# Patient Record
Sex: Female | Born: 1990
Health system: Southern US, Community
[De-identification: ages and names within clinical notes are randomized; demographics above are authoritative.]

## PROBLEM LIST (undated history)

## (undated) DIAGNOSIS — T7840XA Allergy, unspecified, initial encounter: Secondary | ICD-10-CM

## (undated) DIAGNOSIS — G43909 Migraine, unspecified, not intractable, without status migrainosus: Secondary | ICD-10-CM

## (undated) HISTORY — DX: Allergy, unspecified, initial encounter: T78.40XA

## (undated) HISTORY — DX: Migraine, unspecified, not intractable, without status migrainosus: G43.909

---

## 2017-09-26 ENCOUNTER — Ambulatory Visit: Payer: Self-pay | Admitting: Family Medicine

## 2017-09-26 DIAGNOSIS — Z0289 Encounter for other administrative examinations: Secondary | ICD-10-CM

## 2017-09-27 ENCOUNTER — Encounter: Payer: Self-pay | Admitting: Medical

## 2017-09-27 ENCOUNTER — Ambulatory Visit (INDEPENDENT_AMBULATORY_CARE_PROVIDER_SITE_OTHER): Payer: No Typology Code available for payment source | Admitting: Medical

## 2017-09-27 VITALS — BP 110/74 | HR 92 | Temp 98.2°F | Resp 16 | Ht 62.0 in | Wt 145.6 lb

## 2017-09-27 DIAGNOSIS — Z3A01 Less than 8 weeks gestation of pregnancy: Secondary | ICD-10-CM | POA: Diagnosis not present

## 2017-09-27 LAB — HCG, QUANTITATIVE, PREGNANCY: Quantitative HCG: 146.55 m[IU]/mL

## 2017-09-27 NOTE — Progress Notes (Signed)
Subjective:    Patient ID: Toni Ball, female    DOB: 1991/01/06, 27 y.o.   MRN: [id number]  HPI  Pt in for first time.  Pt has no chronic medical problems except seasonal allergies. They are controlled presently.  LMP- 08-25-2017. She was late on 09-24-2017. Pt checked yesterday and had + pregnancy. Today confirmed. Pt has no hx of prior pregnancy.  Pt just graduated from nursing recently. Pt working at [employer]. Pt has been together with father for 9 years.  Pt was trying in past to get pregnant but could not get pregnant.   Pt was off birth control for about a month before getting pregnant.    Review of Systems  Constitutional: Negative for chills, fatigue and fever.  Respiratory: Negative for cough, chest tightness, shortness of breath and wheezing.   Cardiovascular: Negative for chest pain and palpitations.  Gastrointestinal: Negative for abdominal distention, abdominal pain, constipation, diarrhea, nausea and vomiting.  Genitourinary: Negative for difficulty urinating, dysuria, flank pain, frequency and urgency.  Musculoskeletal: Negative for back pain and neck pain.  Neurological: Negative for syncope, facial asymmetry, weakness, light-headedness and numbness.  Hematological: Negative for adenopathy. Does not bruise/bleed easily.  Psychiatric/Behavioral: Negative for behavioral problems, decreased concentration and dysphoric mood. The patient is not nervous/anxious.      Past Medical History:  Diagnosis Date  . Allergy   . Migraine      Social History   Socioeconomic History  . Marital status: Single    Spouse name: Not on file  . Number of children: Not on file  . Years of education: Not on file  . Highest education level: Not on file  Occupational History  . Not on file  Social Needs  . Financial resource strain: Not on file  . Food insecurity:    Worry: Not on file    Inability: Not on file  . Transportation needs:    Medical: Not on file   Non-medical: Not on file  Tobacco Use  . Smoking status: Never Smoker  . Smokeless tobacco: Never Used  Substance and Sexual Activity  . Alcohol use: Never    Frequency: Never  . Drug use: Never  . Sexual activity: Yes  Lifestyle  . Physical activity:    Days per week: Not on file    Minutes per session: Not on file  . Stress: Not on file  Relationships  . Social connections:    Talks on phone: Not on file    Gets together: Not on file    Attends religious service: Not on file    Active member of club or organization: Not on file    Attends meetings of clubs or organizations: Not on file    Relationship status: Not on file  . Intimate partner violence:    Fear of current or ex partner: Not on file    Emotionally abused: Not on file    Physically abused: Not on file    Forced sexual activity: Not on file  Other Topics Concern  . Not on file  Social History Narrative  . Not on file    History reviewed. No pertinent surgical history.  Family History  Problem Relation Age of Onset  . Hyperlipidemia Father     Allergies  Allergen Reactions  . Peanut-Containing Drug Products Anaphylaxis  . Eggs Or Egg-Derived Products   . Milk-Related Compounds     Current Outpatient Medications on File Prior to Visit  Medication Sig Dispense Refill  .  Loratadine 10 MG CAPS Take by mouth.     No current facility-administered medications on file prior to visit.     BP 110/74   Pulse 92   Temp 98.2 F (36.8 C) (Oral)   Resp 16   Ht  (1.575 m)   Wt 145 lb 9.6 oz (66 kg)   SpO2 100%   BMI 26.63 kg/m       Objective:   Physical Exam  General- No acute distress. Pleasant patient.        Assessment & Plan:  Your pregnancy test came back positive.  After discussion, we decided to get a quantitative hCG test.  Please go ahead and start prenatal vitamin.  You found gummy form prenatal vitamin  that you prefer.  So please start that daily.  Referral to OB placed.  I  am not sure how long that will take as you are young and in good health.  So first appointment might be weeks out.  If you have any problems or questions please let me know.  Follow-up as needed.  Estimated date of delivery approx 06-03-2018.

## 2017-09-27 NOTE — Patient Instructions (Signed)
Your pregnancy test came back positive.  After discussion, we decided to get a quantitative hCG test.  Please go ahead and start prenatal vitamin.  You found gummy form prenatal vitamin  that you prefer.  So please start that daily.  Referral to OB placed.  I am not sure how long that will take as you are young and in good health.  So first appointment might be weeks out.  If you have any problems or questions please let me know.  Follow-up as needed.  Estimated date of delivery approx 06-03-2018.

## 2017-09-30 ENCOUNTER — Telehealth: Payer: Self-pay

## 2017-09-30 NOTE — Telephone Encounter (Signed)
Pt. called [facility], and call transferred to author. Author relayed HcG level of 146 per lab drawn on 5/10, and confirmed that pt. has an OB referral from [facility], [place]. Pt had no other questions or concerns at this time.

## 2017-10-02 ENCOUNTER — Ambulatory Visit: Payer: Self-pay | Admitting: *Deleted

## 2017-10-02 NOTE — Telephone Encounter (Signed)
Patient asked if okay to take Benadryl today after picking strawberries. She takes Claritin daily. Advice given taking benadryl today-drink plenty of water today.  Reason for Disposition . Caller requesting information about medication during pregnancy; adult is not ill and triager answers question  Answer Assessment - Initial Assessment Questions 1. SYMPTOMS: "Do you have any symptoms?"     Runny nose after picking strawberries.  2. SEVERITY: If symptoms are present, ask "Are they mild, moderate or severe?"  Protocols used: MEDICATION QUESTION CALL-A-AH

## 2017-10-04 ENCOUNTER — Encounter: Payer: Self-pay | Admitting: Family Medicine

## 2017-10-04 ENCOUNTER — Ambulatory Visit (INDEPENDENT_AMBULATORY_CARE_PROVIDER_SITE_OTHER): Payer: No Typology Code available for payment source | Admitting: Family Medicine

## 2017-10-04 VITALS — BP 109/64 | HR 60 | Wt 147.0 lb

## 2017-10-04 DIAGNOSIS — O3680X0 Pregnancy with inconclusive fetal viability, not applicable or unspecified: Secondary | ICD-10-CM | POA: Diagnosis not present

## 2017-10-04 NOTE — Progress Notes (Signed)
   Subjective:    Patient ID: Toni Ball, female    DOB: 12-30-1990, 27 y.o.   MRN: [id number]  HPI Patient seen for early pregnancy. US shows questionable viability: GS measuring 5wks, but no fetal pole seen. Mild abdominal cramping, no pain.   Review of Systems     Objective:   Physical Exam  Constitutional: She is oriented to person, place, and time. She appears well-developed and well-nourished.  Cardiovascular: Normal rate.  Pulmonary/Chest: Effort normal.  Abdominal: Soft. She exhibits no distension and no mass. There is no tenderness. There is no rebound and no guarding.  Neurological: She is alert and oriented to person, place, and time.      Assessment & Plan:  1. Pregnancy with inconclusive fetal viability, single or unspecified fetus Discussed follow up in 2 weeks to confirm viability, but pt would like to wait until original scheduled appt in 1 month. Ectopic precautions given. - Urine Culture

## 2017-10-04 NOTE — Progress Notes (Signed)
Patient thought appointment for New OB was today. She was scheduled for new ob on June 17th but wanted to be seen today to establish care. Armandina Stammer RN    DATING AND VIABILITY SONOGRAM   Toni Ball is a 27 y.o. year old G1P0 with LMP Patient's last menstrual period was 08/25/2017 (exact date). which would correlate to  [redacted]w[redacted]d weeks gestation.  She has regular menstrual cycles.   She is here today for a confirmatory initial sonogram.    GESTATION: SINGLETON     FETAL ACTIVITY:          Heart rate       None visulalized            ADNEXA: The ovaries are normal.   GESTATIONAL AGE AND  BIOMETRICS:  Gestational criteria: Estimated Date of Delivery: 06/01/18 by LMP now at [redacted]w[redacted]d  Previous Scans:0  GESTATIONAL SAC           0.529cm         5-2 weeks                                                                                   AVERAGE EGA(BY THIS SCAN):  5-2weeks  WORKING EDD( LMP ):  06-01-17    Armandina Stammer 10/04/2017 10:15 AM

## 2017-10-07 ENCOUNTER — Ambulatory Visit (INDEPENDENT_AMBULATORY_CARE_PROVIDER_SITE_OTHER): Payer: No Typology Code available for payment source | Admitting: Medical

## 2017-10-07 ENCOUNTER — Telehealth: Payer: Self-pay

## 2017-10-07 ENCOUNTER — Encounter: Payer: Self-pay | Admitting: Medical

## 2017-10-07 ENCOUNTER — Other Ambulatory Visit: Payer: Self-pay | Admitting: Family Medicine

## 2017-10-07 VITALS — BP 100/68 | HR 72 | Temp 98.4°F | Ht 62.0 in | Wt 146.0 lb

## 2017-10-07 DIAGNOSIS — J301 Allergic rhinitis due to pollen: Secondary | ICD-10-CM | POA: Diagnosis not present

## 2017-10-07 DIAGNOSIS — N3000 Acute cystitis without hematuria: Secondary | ICD-10-CM

## 2017-10-07 DIAGNOSIS — H1031 Unspecified acute conjunctivitis, right eye: Secondary | ICD-10-CM

## 2017-10-07 LAB — URINE CULTURE

## 2017-10-07 MED ORDER — AMOXICILLIN 500 MG PO CAPS
500.0000 mg | ORAL_CAPSULE | Freq: Three times a day (TID) | ORAL | 0 refills | Status: DC
Start: 2017-10-07 — End: 2017-11-12

## 2017-10-07 MED ORDER — TOBRAMYCIN 0.3 % OP SOLN: 2.0000 [drp] | Freq: Four times a day (QID) | OPHTHALMIC | 0 refills | Status: DC

## 2017-10-07 MED ORDER — NEDOCROMIL SODIUM 2 % OP SOLN: 1.0000 [drp] | Freq: Two times a day (BID) | OPHTHALMIC | 0 refills | Status: DC | PRN

## 2017-10-07 NOTE — Progress Notes (Signed)
Pre visit review using our clinic review tool, if applicable. No additional management support is needed unless otherwise documented below in the visit note. 

## 2017-10-07 NOTE — Telephone Encounter (Signed)
-----   Message from Reva Bores, MD sent at 10/07/2017  2:34 PM EDT ----- Has UTI--rx sent in

## 2017-10-07 NOTE — Patient Instructions (Addendum)
   You do appear to have some right-sided bacterial conjunctivitis.  Highly suspicious based on your dried crusty matting to right eyes this morning.  I am prescribing Tobrex eyedrops.  I think you can return to work on Wednesday provided you do not have any recurrent matting during the day on Tuesday.  Also you describe allergic rhinitis with some intermittent episodes of allergy conjunctivitis type symptoms.  After your right eye clears with the bacterial conjunctivitis can use Alocril eyedrops as an add-on to your antihistamine treatment.    Follow-up as needed for any worsening or persisting signs or symptoms.

## 2017-10-07 NOTE — Progress Notes (Signed)
Subjective:    Patient ID: Toni Ball, female    DOB: 05-20-1991, 27 y.o.   MRN: [id number]  HPI  Pt in for rt eye pinkish appearance with some yellow crusting today. Pt had to moisten her eye before she could open this morning. She works as Engineer, civil (consulting).  Pt is pregnant. I saw her just recently. Pt went with her OB. She states everything is good.  Pt took benadryl and claritin. Her ob told her those were ok.      Review of Systems  Constitutional: Negative for chills and fever.  HENT: Positive for congestion. Negative for ear pain, facial swelling, postnasal drip, sinus pressure and sneezing.   Eyes: Positive for redness and itching. Negative for photophobia and visual disturbance.  Respiratory: Negative for cough, chest tightness, shortness of breath and wheezing.   Cardiovascular: Negative for chest pain and palpitations.  Gastrointestinal: Negative for abdominal pain, constipation, nausea and vomiting.  Musculoskeletal: Negative for back pain, myalgias and neck pain.  Skin: Negative for rash.  Hematological: Negative for adenopathy. Does not bruise/bleed easily.  Psychiatric/Behavioral: Negative for behavioral problems, confusion and dysphoric mood.   Past Medical History:  Diagnosis Date  . Allergy   . Migraine      Social History   Socioeconomic History  . Marital status: Single    Spouse name: Not on file  . Number of children: Not on file  . Years of education: Not on file  . Highest education level: Not on file  Occupational History  . Not on file  Social Needs  . Financial resource strain: Not on file  . Food insecurity:    Worry: Not on file    Inability: Not on file  . Transportation needs:    Medical: Not on file    Non-medical: Not on file  Tobacco Use  . Smoking status: Never Smoker  . Smokeless tobacco: Never Used  Substance and Sexual Activity  . Alcohol use: Never    Frequency: Never  . Drug use: Never  . Sexual activity: Yes  Lifestyle  .  Physical activity:    Days per week: Not on file    Minutes per session: Not on file  . Stress: Not on file  Relationships  . Social connections:    Talks on phone: Not on file    Gets together: Not on file    Attends religious service: Not on file    Active member of club or organization: Not on file    Attends meetings of clubs or organizations: Not on file    Relationship status: Not on file  . Intimate partner violence:    Fear of current or ex partner: Not on file    Emotionally abused: Not on file    Physically abused: Not on file    Forced sexual activity: Not on file  Other Topics Concern  . Not on file  Social History Narrative  . Not on file    No past surgical history on file.  Family History  Problem Relation Age of Onset  . Cancer Mother   . Hypertension Mother   . Hyperlipidemia Father   . Hypertension Father   . Hypertension Maternal Grandmother   . Hypertension Maternal Grandfather   . Diabetes Paternal Grandmother   . Hypertension Paternal Grandmother   . Hypertension Paternal Grandfather   . Stroke Neg Hx     Allergies  Allergen Reactions  . Peanut-Containing Drug Products Anaphylaxis  . Eggs Or Egg-Derived  Products   . Milk-Related Compounds     Current Outpatient Medications on File Prior to Visit  Medication Sig Dispense Refill  . Loratadine 10 MG CAPS Take by mouth.     No current facility-administered medications on file prior to visit.     BP 92/62 (BP Location: Left Arm, Patient Position: Sitting, Cuff Size: Normal)   Pulse 72   Temp 98.4 F (36.9 C) (Oral)   Ht  (1.575 m)   Wt 146 lb (66.2 kg)   LMP 08/25/2017 (Exact Date)   SpO2 97%   BMI 26.70 kg/m        Objective:   Physical Exam  General  Mental Status - Alert. General Appearance - Well groomed. Not in acute distress.  Skin Rashes- No Rashes.  HEENT Head- Normal. Ear Auditory Canal - Left- Normal. Right - Normal.Tympanic Membrane- Left- Normal. Right-  Normal. Eye Sclera/Conjunctiva- Left- Normal. Right- Normal. Nose & Sinuses Nasal Mucosa- Left-  Boggy and Congested. Right-  Boggy and  Congested.Bilateral no maxillary and no  frontal sinus pressure. Rt eye mild injected conjunctiva. No matting presently. Left eye is clear. Mouth & Throat Lips: Upper Lip- Normal: no dryness, cracking, pallor, cyanosis, or vesicular eruption. Lower Lip-Normal: no dryness, cracking, pallor, cyanosis or vesicular eruption. Buccal Mucosa- Bilateral- No Aphthous ulcers. Oropharynx- No Discharge or Erythema. Tonsils: Characteristics- Bilateral- No Erythema or Congestion. Size/Enlargement- Bilateral- No enlargement. Discharge- bilateral-None.  Neck Neck- Supple. No Masses.   Chest and Lung Exam Auscultation: Breath Sounds:-Clear even and unlabored.  Cardiovascular Auscultation:Rythm- Regular, rate and rhythm. Murmurs & Other Heart Sounds:Ausculatation of the heart reveal- No Murmurs.  Lymphatic Head & Neck General Head & Neck Lymphatics: Bilateral: Description- No Localized lymphadenopathy.       Assessment & Plan:  You do appear to have some right-sided bacterial conjunctivitis.  Highly suspicious based on your dried crusty matting to right eyes this morning.  I am prescribing Tobrex eyedrops.  I think you can return to work on Wednesday provided you do not have any recurrent matting during the day on Tuesday.  Also you describe allergic rhinitis with some intermittent episodes of allergy conjunctivitis type symptoms.  After your right eye clears with the bacterial conjunctivitis can use Alocril eyedrops as an add-on to your antihistamine treatment.    Follow-up as needed for any worsening or persisting signs or symptoms.  Confirmed class b with our pharmacist today. Advised use tobrex first and get eye cleared then can start alocril as neeeded.  Esperanza Richters, PA-C

## 2017-10-07 NOTE — Telephone Encounter (Signed)
Patient made aware of UTI and need to pick up antibiotic and take for seven days. Patient states understanding. Armandina Stammer RN

## 2017-10-15 ENCOUNTER — Telehealth: Payer: Self-pay

## 2017-10-15 ENCOUNTER — Encounter: Payer: Self-pay | Admitting: Family Medicine

## 2017-10-15 NOTE — Telephone Encounter (Signed)
Pt called the office stating that she was prescribed amoxicillin and thinks that the medication is not working because the UTI is resistant to the medication. Pt states that although she has been on the medication for twenty one days she reviewed her results on MyChart and realized she needs a stronger medication.

## 2017-10-15 NOTE — Telephone Encounter (Signed)
Pt called the office stating that she had some light spotting and cramping over the weekend and it has stopped. Pt states that she was using a panty liners and denies bleeding like a period. Pt states that she was taking medication for a UTI and is now having some yellow discharge and would like to come in to be examined.  Pt is schedule for a NV on 10/16/17 at 8:30am. Pt voiced understanding.

## 2017-10-16 ENCOUNTER — Ambulatory Visit (INDEPENDENT_AMBULATORY_CARE_PROVIDER_SITE_OTHER): Payer: No Typology Code available for payment source

## 2017-10-16 ENCOUNTER — Telehealth: Payer: Self-pay

## 2017-10-16 ENCOUNTER — Encounter: Payer: Self-pay | Admitting: Family Medicine

## 2017-10-16 VITALS — BP 125/52 | HR 81

## 2017-10-16 DIAGNOSIS — N898 Other specified noninflammatory disorders of vagina: Secondary | ICD-10-CM | POA: Diagnosis not present

## 2017-10-16 DIAGNOSIS — O26899 Other specified pregnancy related conditions, unspecified trimester: Principal | ICD-10-CM

## 2017-10-16 NOTE — Telephone Encounter (Signed)
No message sent to me on this patient?

## 2017-10-16 NOTE — Progress Notes (Signed)
Patient complaining of yellowish discharge for a few days. Patient has been treated on antibiotics recently for UTI.  Patient states discharge had odor and her vaginal area is sore and red.    Patient also concerned about antibiotic used to treat her UTI since the susceptibility  came back resistant to amoxicillin- note sent to provider for review to see if different antibiotic should be sent in.

## 2017-10-17 LAB — CERVICOVAGINAL ANCILLARY ONLY
BACTERIAL VAGINITIS: NEGATIVE
CANDIDA VAGINITIS: POSITIVE — AB

## 2017-10-18 ENCOUNTER — Other Ambulatory Visit: Payer: Self-pay | Admitting: Family Medicine

## 2017-10-18 ENCOUNTER — Encounter (INDEPENDENT_AMBULATORY_CARE_PROVIDER_SITE_OTHER): Payer: Self-pay

## 2017-10-18 MED ORDER — MICONAZOLE NITRATE 2 % VA CREA: 1.0000 | TOPICAL_CREAM | Freq: Every day | VAGINAL | 2 refills | Status: DC

## 2017-11-04 ENCOUNTER — Encounter: Payer: No Typology Code available for payment source | Admitting: Family Medicine

## 2017-11-06 ENCOUNTER — Encounter: Payer: No Typology Code available for payment source | Admitting: Obstetrics and Gynecology

## 2017-11-12 ENCOUNTER — Ambulatory Visit (INDEPENDENT_AMBULATORY_CARE_PROVIDER_SITE_OTHER): Payer: No Typology Code available for payment source | Admitting: Obstetrics & Gynecology

## 2017-11-12 ENCOUNTER — Encounter: Payer: Self-pay | Admitting: *Deleted

## 2017-11-12 VITALS — BP 128/52 | HR 92 | Wt 149.0 lb

## 2017-11-12 DIAGNOSIS — Z3687 Encounter for antenatal screening for uncertain dates: Secondary | ICD-10-CM

## 2017-11-12 DIAGNOSIS — Z3400 Encounter for supervision of normal first pregnancy, unspecified trimester: Secondary | ICD-10-CM

## 2017-11-12 DIAGNOSIS — Z3401 Encounter for supervision of normal first pregnancy, first trimester: Secondary | ICD-10-CM | POA: Diagnosis not present

## 2017-11-12 DIAGNOSIS — Z124 Encounter for screening for malignant neoplasm of cervix: Secondary | ICD-10-CM | POA: Diagnosis not present

## 2017-11-12 NOTE — Progress Notes (Signed)
  Subjective:    Toni SeaKimberly Ball is being seen today for her first obstetrical visit.  This is not a planned pregnancy. She is at 1563w2d gestation. Her obstetrical history is significant for none. Relationship with FOB: significant other, living together. Patient does intend to breast feed. Pregnancy history fully reviewed.  Patient reports no complaints.  Review of Systems:   Review of Systems  Objective:     BP (!) 128/52   Pulse 92   Wt 149 lb (67.6 kg)   LMP 08/25/2017 (Exact Date)   BMI 27.25 kg/m  Physical Exam  Exam Breathing, conversing, and ambulating normally Well nourished, well hydrated White female, no apparent distress Heart- rrr Lungs- CTAB Abd- benign Cervix-appears normal   Assessment:    Pregnancy: G1P0 Patient Active Problem List   Diagnosis Date Noted  . Supervision of normal first pregnancy, antepartum 11/12/2017       Plan:     Initial labs drawn. Prenatal vitamins. Problem list reviewed and updated. AFP3 discussed: declined. Role of ultrasound in pregnancy discussed; fetal survey: ordered. Amniocentesis discussed: not indicated. Follow up at 20 weeks She will do Baby scripts optimized schedule NIPS and NOB panel in 2 weeks    Toni Ball 11/12/2017

## 2017-11-12 NOTE — Progress Notes (Signed)
Bedside U/S show IUP with FHT of 162 BPM and CRL is 43.6 BPM.  GA 5586w1d

## 2017-11-13 LAB — CYTOLOGY - PAP
Adequacy: ABSENT
DIAGNOSIS: NEGATIVE

## 2017-11-14 LAB — CULTURE, OB URINE

## 2017-11-14 LAB — URINE CULTURE, OB REFLEX

## 2017-11-25 ENCOUNTER — Other Ambulatory Visit: Payer: No Typology Code available for payment source

## 2017-11-25 DIAGNOSIS — Z3400 Encounter for supervision of normal first pregnancy, unspecified trimester: Secondary | ICD-10-CM

## 2017-11-27 ENCOUNTER — Encounter: Payer: Self-pay | Admitting: Obstetrics & Gynecology

## 2017-11-27 DIAGNOSIS — O09899 Supervision of other high risk pregnancies, unspecified trimester: Secondary | ICD-10-CM | POA: Insufficient documentation

## 2017-11-27 DIAGNOSIS — Z283 Underimmunization status: Secondary | ICD-10-CM | POA: Insufficient documentation

## 2017-11-27 DIAGNOSIS — O9989 Other specified diseases and conditions complicating pregnancy, childbirth and the puerperium: Secondary | ICD-10-CM

## 2017-11-27 DIAGNOSIS — Z2839 Other underimmunization status: Secondary | ICD-10-CM | POA: Insufficient documentation

## 2017-11-30 LAB — OBSTETRIC PANEL
Antibody Screen: NOT DETECTED
BASOS ABS: 38 {cells}/uL (ref 0–200)
BASOS PCT: 0.4 %
EOS ABS: 182 {cells}/uL (ref 15–500)
Eosinophils Relative: 1.9 %
HCT: 34.2 % — ABNORMAL LOW (ref 35.0–45.0)
HEP B S AG: NONREACTIVE
Hemoglobin: 11.6 g/dL — ABNORMAL LOW (ref 11.7–15.5)
Lymphs Abs: 2054 cells/uL (ref 850–3900)
MCH: 30.1 pg (ref 27.0–33.0)
MCHC: 33.9 g/dL (ref 32.0–36.0)
MCV: 88.8 fL (ref 80.0–100.0)
MPV: 11 fL (ref 7.5–12.5)
Monocytes Relative: 7.1 %
Neutro Abs: 6643 cells/uL (ref 1500–7800)
Neutrophils Relative %: 69.2 %
Platelets: 255 10*3/uL (ref 140–400)
RBC: 3.85 10*6/uL (ref 3.80–5.10)
RDW: 12.3 % (ref 11.0–15.0)
RPR: NONREACTIVE
Rubella: <0.9 index — ABNORMAL LOW
TOTAL LYMPHOCYTE: 21.4 %
WBC: 9.6 10*3/uL (ref 3.8–10.8)
WBCMIX: 682 {cells}/uL (ref 200–950)

## 2017-11-30 LAB — CYSTIC FIBROSIS DIAGNOSTIC STUDY

## 2017-11-30 LAB — HIV ANTIBODY (ROUTINE TESTING W REFLEX): HIV: NONREACTIVE

## 2017-12-02 ENCOUNTER — Encounter: Payer: Self-pay | Admitting: *Deleted

## 2017-12-02 DIAGNOSIS — Z3400 Encounter for supervision of normal first pregnancy, unspecified trimester: Secondary | ICD-10-CM

## 2017-12-16 ENCOUNTER — Encounter (INDEPENDENT_AMBULATORY_CARE_PROVIDER_SITE_OTHER): Payer: Self-pay

## 2017-12-16 ENCOUNTER — Ambulatory Visit (INDEPENDENT_AMBULATORY_CARE_PROVIDER_SITE_OTHER): Payer: No Typology Code available for payment source | Admitting: Obstetrics & Gynecology

## 2017-12-16 DIAGNOSIS — Z3400 Encounter for supervision of normal first pregnancy, unspecified trimester: Secondary | ICD-10-CM

## 2017-12-16 NOTE — Progress Notes (Signed)
Lab only visit.  On baby scripts.  Needs to take BP once a week.  BP normal today.

## 2017-12-17 LAB — ALPHA FETOPROTEIN, MATERNAL
AFP MoM: 0.88
AFP, Serum: 29.2 ng/mL
CALC'D GESTATIONAL AGE: 16.1 wk
MATERNAL WT: 150 [lb_av]
Risk for ONTD: <1
TWINS-AFP: 1

## 2017-12-18 ENCOUNTER — Encounter: Payer: Self-pay | Admitting: Obstetrics & Gynecology

## 2018-01-02 ENCOUNTER — Encounter (HOSPITAL_COMMUNITY): Payer: Self-pay

## 2018-01-09 ENCOUNTER — Other Ambulatory Visit: Payer: Self-pay | Admitting: Obstetrics & Gynecology

## 2018-01-09 ENCOUNTER — Ambulatory Visit (HOSPITAL_COMMUNITY)
Admission: RE | Admit: 2018-01-09 | Discharge: 2018-01-09 | Disposition: A | Discharge status: Home / Self Care | Payer: No Typology Code available for payment source | Source: Ambulatory Visit | Attending: Obstetrics & Gynecology | Admitting: Obstetrics & Gynecology

## 2018-01-09 DIAGNOSIS — Z3689 Encounter for other specified antenatal screening: Secondary | ICD-10-CM | POA: Insufficient documentation

## 2018-01-09 DIAGNOSIS — Z363 Encounter for antenatal screening for malformations: Secondary | ICD-10-CM

## 2018-01-09 DIAGNOSIS — Z3A19 19 weeks gestation of pregnancy: Secondary | ICD-10-CM | POA: Diagnosis not present

## 2018-01-09 DIAGNOSIS — Z3400 Encounter for supervision of normal first pregnancy, unspecified trimester: Secondary | ICD-10-CM

## 2018-01-13 ENCOUNTER — Ambulatory Visit (INDEPENDENT_AMBULATORY_CARE_PROVIDER_SITE_OTHER): Payer: No Typology Code available for payment source | Admitting: Obstetrics & Gynecology

## 2018-01-13 ENCOUNTER — Encounter: Payer: Self-pay | Admitting: *Deleted

## 2018-01-13 VITALS — BP 110/69 | HR 84 | Wt 162.0 lb

## 2018-01-13 DIAGNOSIS — Z3402 Encounter for supervision of normal first pregnancy, second trimester: Secondary | ICD-10-CM

## 2018-01-13 DIAGNOSIS — Z23 Encounter for immunization: Secondary | ICD-10-CM | POA: Diagnosis not present

## 2018-01-13 DIAGNOSIS — Z3400 Encounter for supervision of normal first pregnancy, unspecified trimester: Secondary | ICD-10-CM

## 2018-01-13 NOTE — Addendum Note (Signed)
Addended by: Granville LewisLARK, Leighla Chestnutt L on: 01/13/2018 01:35 PM   Modules accepted: Orders

## 2018-01-13 NOTE — Progress Notes (Signed)
   PRENATAL VISIT NOTE  Subjective:  Toni Ball is a 27 y.o. G1P0 at 3745w1d being seen today for ongoing prenatal care.  She is currently monitored for the following issues for this low-risk pregnancy and has Supervision of normal first pregnancy, antepartum and Rubella non-immune status, antepartum on their problem list.  Patient reports no complaints.  Contractions: Not present. Vag. Bleeding: None.  Movement: Present. Denies leaking of fluid.   The following portions of the patient's history were reviewed and updated as appropriate: allergies, current medications, past family history, past medical history, past social history, past surgical history and problem list. Problem list updated.  Objective:   Vitals:   01/13/18 1259  BP: 110/69  Pulse: 84  Weight: 162 lb (73.5 kg)    Fetal Status: Fetal Heart Rate (bpm): 159   Movement: Present     General:  Alert, oriented and cooperative. Patient is in no acute distress.  Skin: Skin is warm and dry. No rash noted.   Cardiovascular: Normal heart rate noted  Respiratory: Normal respiratory effort, no problems with respiration noted  Abdomen: Soft, gravid, appropriate for gestational age.  Pain/Pressure: Absent     Pelvic: Cervical exam deferred        Extremities: Normal range of motion.  Edema: None  Mental Status: Normal mood and affect. Normal behavior. Normal judgment and thought content.   Assessment and Plan:  Pregnancy: G1P0 at 4745w1d  1. Supervision of normal first pregnancy, antepartum - reassurance about round ligament pain  Preterm labor symptoms and general obstetric precautions including but not limited to vaginal bleeding, contractions, leaking of fluid and fetal movement were reviewed in detail with the patient. Please refer to After Visit Summary for other counseling recommendations.  Return in about 8 weeks (around 03/10/2018) for 2 hour GTT.  No future appointments.  Allie BossierMyra C Janesa Dockery, MD

## 2018-03-17 ENCOUNTER — Ambulatory Visit (INDEPENDENT_AMBULATORY_CARE_PROVIDER_SITE_OTHER): Payer: No Typology Code available for payment source | Admitting: Obstetrics & Gynecology

## 2018-03-17 VITALS — BP 118/73 | HR 96 | Wt 175.0 lb

## 2018-03-17 DIAGNOSIS — Z23 Encounter for immunization: Secondary | ICD-10-CM

## 2018-03-17 DIAGNOSIS — Z3403 Encounter for supervision of normal first pregnancy, third trimester: Secondary | ICD-10-CM

## 2018-03-17 DIAGNOSIS — Z3400 Encounter for supervision of normal first pregnancy, unspecified trimester: Secondary | ICD-10-CM

## 2018-03-17 DIAGNOSIS — Z3480 Encounter for supervision of other normal pregnancy, unspecified trimester: Secondary | ICD-10-CM

## 2018-03-17 NOTE — Progress Notes (Signed)
   PRENATAL VISIT NOTE  Subjective:  Shaquitta Burbridge is a 27 y.o. G1P0 at [redacted]w[redacted]d being seen today for ongoing prenatal care.  She is currently monitored for the following issues for this low-risk pregnancy and has Supervision of normal first pregnancy, antepartum and Rubella non-immune status, antepartum on their problem list.  Patient reports no complaints.  Contractions: Not present. Vag. Bleeding: None.  Movement: Present. Denies leaking of fluid.   The following portions of the patient's history were reviewed and updated as appropriate: allergies, current medications, past family history, past medical history, past social history, past surgical history and problem list. Problem list updated.  Objective:   Vitals:   03/17/18 0848  BP: 118/73  Pulse: 96  Weight: 175 lb (79.4 kg)    Fetal Status: Fetal Heart Rate (bpm): 147   Movement: Present     General:  Alert, oriented and cooperative. Patient is in no acute distress.  Skin: Skin is warm and dry. No rash noted.   Cardiovascular: Normal heart rate noted  Respiratory: Normal respiratory effort, no problems with respiration noted  Abdomen: Soft, gravid, appropriate for gestational age.  Pain/Pressure: Absent     Pelvic: Cervical exam deferred        Extremities: Normal range of motion.  Edema: None  Mental Status: Normal mood and affect. Normal behavior. Normal judgment and thought content.   Assessment and Plan:  Pregnancy: G1P0 at [redacted]w[redacted]d  1. Supervision of other normal pregnancy, antepartum  - 2Hr GTT w/ 1 Hr Carpenter 75 g - CBC - HIV antibody (with reflex) - RPR - TDAP for patient and for husband today 2. Supervision of normal first pregnancy, antepartum   Preterm labor symptoms and general obstetric precautions including but not limited to vaginal bleeding, contractions, leaking of fluid and fetal movement were reviewed in detail with the patient. Please refer to After Visit Summary for other counseling  recommendations.  No follow-ups on file.  No future appointments.  Allie Bossier, MD

## 2018-03-18 LAB — CBC
HCT: 27.5 % — ABNORMAL LOW (ref 35.0–45.0)
Hemoglobin: 9.2 g/dL — ABNORMAL LOW (ref 11.7–15.5)
MCH: 28.8 pg (ref 27.0–33.0)
MCHC: 33.5 g/dL (ref 32.0–36.0)
MCV: 85.9 fL (ref 80.0–100.0)
MPV: 10.8 fL (ref 7.5–12.5)
Platelets: 292 10*3/uL (ref 140–400)
RBC: 3.2 10*6/uL — ABNORMAL LOW (ref 3.80–5.10)
RDW: 12.3 % (ref 11.0–15.0)
WBC: 13.7 10*3/uL — AB (ref 3.8–10.8)

## 2018-03-18 LAB — 2HR GTT W 1 HR, CARPENTER, 75 G
Glucose, 1 Hr, Gest: 155 mg/dL (ref 65–179)
Glucose, 2 Hr, Gest: 104 mg/dL (ref 65–152)
Glucose, Fasting, Gest: 79 mg/dL (ref 65–91)

## 2018-03-18 LAB — RPR: RPR: NONREACTIVE

## 2018-03-18 LAB — HIV ANTIBODY (ROUTINE TESTING W REFLEX): HIV: NONREACTIVE

## 2018-03-19 ENCOUNTER — Telehealth: Payer: Self-pay | Admitting: *Deleted

## 2018-03-19 ENCOUNTER — Other Ambulatory Visit: Payer: Self-pay | Admitting: Obstetrics & Gynecology

## 2018-03-19 ENCOUNTER — Encounter: Payer: Self-pay | Admitting: Obstetrics & Gynecology

## 2018-03-19 ENCOUNTER — Encounter: Payer: Self-pay | Admitting: *Deleted

## 2018-03-19 DIAGNOSIS — O99019 Anemia complicating pregnancy, unspecified trimester: Secondary | ICD-10-CM | POA: Insufficient documentation

## 2018-03-19 NOTE — Progress Notes (Signed)
feraheme for anemia

## 2018-03-19 NOTE — Telephone Encounter (Signed)
Pt is scheduled for Feraheme infusion 03/28/18 @ 2:00 @ [facility] Short stay.  Pt was sent message through my chart.

## 2018-03-19 NOTE — Telephone Encounter (Signed)
-----   Message from Allie Bossier, MD sent at 03/19/2018  8:02 AM EDT ----- I have ordered feraheme for 2 doses for her

## 2018-03-28 ENCOUNTER — Ambulatory Visit (HOSPITAL_COMMUNITY)
Admission: RE | Admit: 2018-03-28 | Discharge: 2018-03-28 | Disposition: A | Discharge status: Home / Self Care | Payer: No Typology Code available for payment source | Source: Ambulatory Visit | Attending: Obstetrics & Gynecology | Admitting: Obstetrics & Gynecology

## 2018-03-28 DIAGNOSIS — O99013 Anemia complicating pregnancy, third trimester: Secondary | ICD-10-CM | POA: Insufficient documentation

## 2018-03-28 MED ORDER — SODIUM CHLORIDE 0.9 % IV SOLN
510.0000 mg | INTRAVENOUS | Status: DC
  Administered 2018-03-28: 510 mg via INTRAVENOUS
  Filled 2018-03-28: qty 17

## 2018-03-28 NOTE — Discharge Instructions (Signed)

## 2018-04-04 ENCOUNTER — Ambulatory Visit (HOSPITAL_COMMUNITY)
Admission: RE | Admit: 2018-04-04 | Discharge: 2018-04-04 | Disposition: A | Discharge status: Home / Self Care | Payer: No Typology Code available for payment source | Source: Ambulatory Visit | Attending: Obstetrics & Gynecology | Admitting: Obstetrics & Gynecology

## 2018-04-04 DIAGNOSIS — O99013 Anemia complicating pregnancy, third trimester: Secondary | ICD-10-CM | POA: Insufficient documentation

## 2018-04-04 DIAGNOSIS — Z3A32 32 weeks gestation of pregnancy: Secondary | ICD-10-CM | POA: Insufficient documentation

## 2018-04-04 MED ORDER — SODIUM CHLORIDE 0.9 % IV SOLN
510.0000 mg | INTRAVENOUS | Status: DC
  Administered 2018-04-04: 510 mg via INTRAVENOUS
  Filled 2018-04-04: qty 17

## 2018-04-07 ENCOUNTER — Ambulatory Visit (INDEPENDENT_AMBULATORY_CARE_PROVIDER_SITE_OTHER): Payer: No Typology Code available for payment source | Admitting: Obstetrics & Gynecology

## 2018-04-07 VITALS — BP 116/70 | HR 102 | Wt 177.0 lb

## 2018-04-07 DIAGNOSIS — O99013 Anemia complicating pregnancy, third trimester: Secondary | ICD-10-CM

## 2018-04-07 DIAGNOSIS — Z3400 Encounter for supervision of normal first pregnancy, unspecified trimester: Secondary | ICD-10-CM

## 2018-04-07 DIAGNOSIS — Z3A32 32 weeks gestation of pregnancy: Secondary | ICD-10-CM

## 2018-04-07 MED ORDER — FERROUS SULFATE 325 (65 FE) MG PO TABS: 325.0000 mg | ORAL_TABLET | Freq: Every day | ORAL | 3 refills | Status: DC

## 2018-04-07 MED ORDER — HYDROCORTISONE 1 % EX OINT: 1.0000 "application " | TOPICAL_OINTMENT | Freq: Two times a day (BID) | CUTANEOUS | 0 refills | Status: DC

## 2018-04-07 NOTE — Progress Notes (Signed)
Rash on nipple    PRENATAL VISIT NOTE  Subjective:  Toni Ball is a 27 y.o. G1P0 at 8269w1d being seen today for ongoing prenatal care.  She is currently monitored for the following issues for this low-risk pregnancy and has Supervision of normal first pregnancy, antepartum; Rubella non-immune status, antepartum; and Anemia in pregnancy on their problem list.  Patient reports no complaints.  Contractions: Not present. Vag. Bleeding: None.  Movement: Present. Denies leaking of fluid.   The following portions of the patient's history were reviewed and updated as appropriate: allergies, current medications, past family history, past medical history, past social history, past surgical history and problem list. Problem list updated.  Objective:   Vitals:   04/07/18 0914  BP: 116/70  Pulse: (!) 102  Weight: 177 lb (80.3 kg)    Fetal Status: Fetal Heart Rate (bpm): 151   Movement: Present     General:  Alert, oriented and cooperative. Patient is in no acute distress.  Skin: Skin is warm and dry. No rash noted.   Cardiovascular: Normal heart rate noted  Respiratory: Normal respiratory effort, no problems with respiration noted  Abdomen: Soft, gravid, appropriate for gestational age.  Pain/Pressure: Present     Pelvic: Cervical exam deferred        Extremities: Normal range of motion.  Edema: None  Mental Status: Normal mood and affect. Normal behavior. Normal judgment and thought content.   Assessment and Plan:  Pregnancy: G1P0 at 769w1d  1. Supervision of normal first pregnancy, antepartum Babyscripts compliant Resolving rash--use hydrocortisone ointment. Pt to sign up for prenatal classes and breast feeding classes.  2. Anemia during pregnancy in third trimester S/p 2 iron infusion; start daily iron; check cbc at 36 weeks  Preterm labor symptoms and general obstetric precautions including but not limited to vaginal bleeding, contractions, leaking of fluid and fetal movement  were reviewed in detail with the patient. Please refer to After Visit Summary for other counseling recommendations.  No follow-ups on file.  BABYSCRIPTS PATIENT: [ ] Initial [ ] 12 [ ] 20 [ ] 28 [ ] 32 [ ] 36 [ ] 38 [ ] 39 [ ] 40  Elsie LincolnKelly Milianna Ericsson, MD

## 2018-05-05 ENCOUNTER — Ambulatory Visit (INDEPENDENT_AMBULATORY_CARE_PROVIDER_SITE_OTHER): Payer: No Typology Code available for payment source | Admitting: Obstetrics & Gynecology

## 2018-05-05 VITALS — BP 110/70 | HR 91 | Wt 184.0 lb

## 2018-05-05 DIAGNOSIS — Z3400 Encounter for supervision of normal first pregnancy, unspecified trimester: Secondary | ICD-10-CM

## 2018-05-05 DIAGNOSIS — O26893 Other specified pregnancy related conditions, third trimester: Secondary | ICD-10-CM

## 2018-05-05 DIAGNOSIS — N898 Other specified noninflammatory disorders of vagina: Secondary | ICD-10-CM

## 2018-05-05 DIAGNOSIS — Z113 Encounter for screening for infections with a predominantly sexual mode of transmission: Secondary | ICD-10-CM | POA: Diagnosis not present

## 2018-05-05 DIAGNOSIS — Z3480 Encounter for supervision of other normal pregnancy, unspecified trimester: Secondary | ICD-10-CM

## 2018-05-05 DIAGNOSIS — Z3483 Encounter for supervision of other normal pregnancy, third trimester: Secondary | ICD-10-CM

## 2018-05-05 LAB — OB RESULTS CONSOLE GC/CHLAMYDIA: Gonorrhea: NEGATIVE

## 2018-05-05 LAB — OB RESULTS CONSOLE GBS: GBS: POSITIVE

## 2018-05-05 NOTE — Progress Notes (Signed)
Pt c/o "watery discharge" for the past week    PRENATAL VISIT NOTE  Subjective:  Toni Ball is a 27 y.o. G1P0 at 7931w1d being seen today for ongoing prenatal care.  She is currently monitored for the following issues for this low-risk pregnancy and has Supervision of normal first pregnancy, antepartum; Rubella non-immune status, antepartum; and Anemia in pregnancy on their problem list.  Patient reports leaking of fluid for about a week.  Pelvic pressuer esp of pubic symphysis.  Contractions: Not present. Vag. Bleeding: None.  Movement: Present. Denies leaking of fluid.   The following portions of the patient's history were reviewed and updated as appropriate: allergies, current medications, past family history, past medical history, past social history, past surgical history and problem list. Problem list updated.  Objective:   Vitals:   05/05/18 0948  BP: 110/70  Pulse: 91  Weight: 184 lb (83.5 kg)    Fetal Status: Fetal Heart Rate (bpm): 147   Movement: Present     General:  Alert, oriented and cooperative. Patient is in no acute distress.  Skin: Skin is warm and dry. No rash noted.   Cardiovascular: Normal heart rate noted  Respiratory: Normal respiratory effort, no problems with respiration noted  Abdomen: Soft, gravid, appropriate for gestational age.  Pain/Pressure: Present     Pelvic: Cervical exam performed        Extremities: Normal range of motion.  Edema: None  Mental Status: Normal mood and affect. Normal behavior. Normal judgment and thought content.   Assessment and Plan:  Pregnancy: G1P0 at 9531w1d  1. Supervision of other normal pregnancy, antepartum -BD affirm - Culture, beta strep (group b only) - GC/Chlamydia probe amp ([place])not at [facility] - CBC -Fern test--Pt has no ferning and not ruptured based of office testing.  -Bedside US Vertex -Babyscripts compliant  2.  Pelvic Pressure Will write for patient to have reduced schedule (patient load) at  work since pelvic pressure and symphysis pain.  Term labor symptoms and general obstetric precautions including but not limited to vaginal bleeding, contractions, leaking of fluid and fetal movement were reviewed in detail with the patient. Please refer to After Visit Summary for other counseling recommendations.  Return in about 2 weeks (around 05/19/2018).  No future appointments.  Elsie LincolnKelly Wise Fees, MD

## 2018-05-06 LAB — GC/CHLAMYDIA PROBE AMP (~~LOC~~) NOT AT ARMC
CHLAMYDIA, DNA PROBE: NEGATIVE
Neisseria Gonorrhea: NEGATIVE

## 2018-05-06 LAB — CERVICOVAGINAL ANCILLARY ONLY
Bacterial vaginitis: NEGATIVE
CANDIDA VAGINITIS: NEGATIVE

## 2018-05-07 LAB — CBC
HCT: 33.4 % — ABNORMAL LOW (ref 35.0–45.0)
Hemoglobin: 11.3 g/dL — ABNORMAL LOW (ref 11.7–15.5)
MCH: 29.4 pg (ref 27.0–33.0)
MCHC: 33.8 g/dL (ref 32.0–36.0)
MCV: 87 fL (ref 80.0–100.0)
MPV: 11.3 fL (ref 7.5–12.5)
Platelets: 248 10*3/uL (ref 140–400)
RBC: 3.84 10*6/uL (ref 3.80–5.10)
RDW: 15.5 % — ABNORMAL HIGH (ref 11.0–15.0)
WBC: 11.4 10*3/uL — AB (ref 3.8–10.8)

## 2018-05-07 LAB — CULTURE, BETA STREP (GROUP B ONLY)
MICRO NUMBER:: 91502394
SPECIMEN QUALITY:: ADEQUATE

## 2018-05-19 ENCOUNTER — Ambulatory Visit: Payer: No Typology Code available for payment source | Admitting: Obstetrics & Gynecology

## 2018-05-19 VITALS — BP 126/80 | HR 101 | Wt 184.0 lb

## 2018-05-19 DIAGNOSIS — Z3400 Encounter for supervision of normal first pregnancy, unspecified trimester: Secondary | ICD-10-CM

## 2018-05-21 NOTE — L&D Delivery Note (Addendum)
Patient: Toni Ball MRN: [id number]  GBS status: positive, treated with PCN  Patient is a 28 y.o. now G1P1  s/p NSVD at [redacted]w[redacted]d, who was admitted for IOL due to post dates. SROM 4h 50m prior to delivery with clear fluid.    Delivery Note At 8:36 AM a viable female was delivered via Vaginal, Spontaneous (Presentation:LOA ;  ).  APGAR: 9, 9; weight pending  .   Placenta status: intact, spontaneous , .  Cord: 3 vessel   Anesthesia:  Epidural  Episiotomy: None Lacerations: periurethral hemostatic, 1st degree laceration repaired Suture Repair: 3.0 vicyrl CT Est. Blood Loss (mL): 365  Mom to postpartum.  Baby to Couplet care / Skin to Skin.  Sigurd Sos Deloglos 06/04/2018, 9:33 AM    Head delivered LOA. No nuchal cord present. Shoulder and body delivered in usual fashion. Infant with spontaneous cry, placed on mother's abdomen, dried and bulb suctioned. Cord clamped x 2 after 1-minute delay, and cut by family member. Cord blood drawn. Placenta delivered spontaneously with gentle cord traction. Fundus firm with massage and Pitocin. Perineum inspected and found to have first degree laceration, which was repaired with with good hemostasis achieved.  I spoke with and examined patient and agree with resident/PA-S/MS/SNM's note and plan of care.  Birth under my supervision Jerita Charania, CNM, [facility] 06/04/2018 9:42 AM

## 2018-05-26 ENCOUNTER — Ambulatory Visit (INDEPENDENT_AMBULATORY_CARE_PROVIDER_SITE_OTHER): Payer: No Typology Code available for payment source | Admitting: Obstetrics & Gynecology

## 2018-05-26 VITALS — BP 113/75 | HR 86 | Wt 185.0 lb

## 2018-05-26 DIAGNOSIS — Z3400 Encounter for supervision of normal first pregnancy, unspecified trimester: Secondary | ICD-10-CM

## 2018-05-26 DIAGNOSIS — Z3403 Encounter for supervision of normal first pregnancy, third trimester: Secondary | ICD-10-CM

## 2018-05-26 NOTE — Progress Notes (Signed)
   PRENATAL VISIT NOTE  Subjective:  Toni Ball is a 27 y.o. G1P0 at [redacted]w[redacted]d being seen today for ongoing prenatal care.  She is currently monitored for the following issues for this low-risk pregnancy and has Supervision of normal first pregnancy, antepartum; Rubella non-immune status, antepartum; and Anemia in pregnancy on their problem list.  Patient reports no complaints.  Contractions: Irritability. Vag. Bleeding: None.  Movement: Present. Denies leaking of fluid.   The following portions of the patient's history were reviewed and updated as appropriate: allergies, current medications, past family history, past medical history, past social history, past surgical history and problem list. Problem list updated.  Objective:   Vitals:   05/26/18 0854  BP: 113/75  Pulse: 86  Weight: 185 lb (83.9 kg)    Fetal Status: Fetal Heart Rate (bpm): 140   Movement: Present     General:  Alert, oriented and cooperative. Patient is in no acute distress.  Skin: Skin is warm and dry. No rash noted.   Cardiovascular: Normal heart rate noted  Respiratory: Normal respiratory effort, no problems with respiration noted  Abdomen: Soft, gravid, appropriate for gestational age.  Pain/Pressure: Present     Pelvic: Cervical exam performed      cl/softer/posterior/thick  Extremities: Normal range of motion.  Edema: Trace  Mental Status: Normal mood and affect. Normal behavior. Normal judgment and thought content.   Assessment and Plan:  Pregnancy: G1P0 at [redacted]w[redacted]d  Bedside US to confirm position--RN Clark--Vertex   Term labor symptoms and general obstetric precautions including but not limited to vaginal bleeding, contractions, leaking of fluid and fetal movement were reviewed in detail with the patient. Please refer to After Visit Summary for other counseling recommendations.  Return in about 1 week (around 06/02/2018).  No future appointments.  Elsie Lincoln, MD

## 2018-05-29 ENCOUNTER — Telehealth: Payer: Self-pay

## 2018-05-29 NOTE — Telephone Encounter (Addendum)
Returning pt's call. Pt states she has been having irregular contractions sometimes every 10 minutes for the last three weeks. She states she is having them today and they're inconsistently every 10 minutes. 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 Pt wants advice on if she should go to hospital. I told pt as of now it does not seem like contractions are strong enough or frequent enough to report to hospital.Pt states she is working and talking through the contractions but, is feeling some back pain with them. I told pt that this could be her body starting to get ready but, to monitor contractions and if they become more intense and occurring 5 mins apart or less or if her water breaks or sees bleeding then she should report to [facility]. Pt expressed understanding.

## 2018-06-02 ENCOUNTER — Encounter (HOSPITAL_COMMUNITY): Payer: Self-pay | Admitting: *Deleted

## 2018-06-02 ENCOUNTER — Telehealth (HOSPITAL_COMMUNITY): Payer: Self-pay | Admitting: *Deleted

## 2018-06-02 ENCOUNTER — Ambulatory Visit (INDEPENDENT_AMBULATORY_CARE_PROVIDER_SITE_OTHER): Payer: No Typology Code available for payment source | Admitting: Obstetrics & Gynecology

## 2018-06-02 VITALS — BP 115/80 | HR 85 | Wt 184.0 lb

## 2018-06-02 DIAGNOSIS — Z3400 Encounter for supervision of normal first pregnancy, unspecified trimester: Secondary | ICD-10-CM

## 2018-06-02 NOTE — Progress Notes (Addendum)
   PRENATAL VISIT NOTE  Subjective:  Toni Ball is a 28 y.o. G1P0 at [redacted]w[redacted]d being seen today for ongoing prenatal care.  She is currently monitored for the following issues for this low-risk pregnancy and has Supervision of normal first pregnancy, antepartum; Rubella non-immune status, antepartum; and Anemia in pregnancy on their problem list.  Patient reports no complaints. She would love to be induced. She had lots of contractions all weekend.  Contractions: Irritability. Vag. Bleeding: None.  Movement: Present. Denies leaking of fluid.   The following portions of the patient's history were reviewed and updated as appropriate: allergies, current medications, past family history, past medical history, past social history, past surgical history and problem list. Problem list updated.  Objective:   Vitals:   06/02/18 0910  BP: 115/80  Pulse: 85  Weight: 184 lb (83.5 kg)    Fetal Status:     Movement: Present     General:  Alert, oriented and cooperative. Patient is in no acute distress.  Skin: Skin is warm and dry. No rash noted.   Cardiovascular: Normal heart rate noted  Respiratory: Normal respiratory effort, no problems with respiration noted  Abdomen: Soft, gravid, appropriate for gestational age.  Pain/Pressure: Present     Pelvic: Cervical exam performed        Extremities: Normal range of motion.  Edema: Trace  Mental Status: Normal mood and affect. Normal behavior. Normal judgment and thought content.   Assessment and Plan:  Pregnancy: G1P0 at [redacted]w[redacted]d  1. Supervision of normal first pregnancy, antepartum - She will come back late afternoon for Foley bulb with IOL tomorrow AM.  Addendum at 1640: Foley placed in her cervix with 50 cc fluid. She tolerated the procedure well NST done Term labor symptoms and general obstetric precautions including but not limited to vaginal bleeding, contractions, leaking of fluid and fetal movement were reviewed in detail with the  patient. Please refer to After Visit Summary for other counseling recommendations.  No follow-ups on file.  No future appointments.  Allie Bossier, MD

## 2018-06-02 NOTE — Telephone Encounter (Signed)
Preadmission screen  

## 2018-06-03 ENCOUNTER — Encounter (HOSPITAL_COMMUNITY): Payer: Self-pay

## 2018-06-03 ENCOUNTER — Inpatient Hospital Stay (HOSPITAL_COMMUNITY)
Admission: RE | Admit: 2018-06-03 | Discharge: 2018-06-06 | DRG: 807 | Disposition: A | Discharge status: Home / Self Care | Payer: No Typology Code available for payment source | Attending: Obstetrics and Gynecology | Admitting: Obstetrics and Gynecology

## 2018-06-03 ENCOUNTER — Other Ambulatory Visit: Payer: Self-pay

## 2018-06-03 ENCOUNTER — Inpatient Hospital Stay (HOSPITAL_COMMUNITY): Payer: No Typology Code available for payment source | Admitting: Anesthesiology

## 2018-06-03 VITALS — BP 115/78 | HR 79 | Temp 97.9°F | Resp 18 | Ht 61.75 in | Wt 184.9 lb

## 2018-06-03 DIAGNOSIS — D649 Anemia, unspecified: Secondary | ICD-10-CM | POA: Diagnosis present

## 2018-06-03 DIAGNOSIS — Z283 Underimmunization status: Secondary | ICD-10-CM

## 2018-06-03 DIAGNOSIS — Z3493 Encounter for supervision of normal pregnancy, unspecified, third trimester: Secondary | ICD-10-CM | POA: Insufficient documentation

## 2018-06-03 DIAGNOSIS — O480 Post-term pregnancy: Secondary | ICD-10-CM | POA: Diagnosis present

## 2018-06-03 DIAGNOSIS — O9902 Anemia complicating childbirth: Secondary | ICD-10-CM | POA: Diagnosis present

## 2018-06-03 DIAGNOSIS — O99824 Streptococcus B carrier state complicating childbirth: Secondary | ICD-10-CM | POA: Diagnosis present

## 2018-06-03 DIAGNOSIS — O9989 Other specified diseases and conditions complicating pregnancy, childbirth and the puerperium: Secondary | ICD-10-CM

## 2018-06-03 DIAGNOSIS — O09899 Supervision of other high risk pregnancies, unspecified trimester: Secondary | ICD-10-CM

## 2018-06-03 DIAGNOSIS — Z3400 Encounter for supervision of normal first pregnancy, unspecified trimester: Secondary | ICD-10-CM

## 2018-06-03 DIAGNOSIS — Z3A40 40 weeks gestation of pregnancy: Secondary | ICD-10-CM

## 2018-06-03 DIAGNOSIS — O99019 Anemia complicating pregnancy, unspecified trimester: Secondary | ICD-10-CM | POA: Diagnosis present

## 2018-06-03 DIAGNOSIS — Z2839 Encounter for supervision of normal pregnancy, unspecified, unspecified trimester: Secondary | ICD-10-CM

## 2018-06-03 LAB — RPR: RPR Ser Ql: NONREACTIVE

## 2018-06-03 LAB — ABO/RH: ABO/RH(D): A POS

## 2018-06-03 LAB — CBC
HCT: 37.9 % (ref 36.0–46.0)
Hemoglobin: 12.4 g/dL (ref 12.0–15.0)
MCH: 29.4 pg (ref 26.0–34.0)
MCHC: 32.7 g/dL (ref 30.0–36.0)
MCV: 89.8 fL (ref 80.0–100.0)
NRBC: 0 % (ref 0.0–0.2)
Platelets: 254 10*3/uL (ref 150–400)
RBC: 4.22 MIL/uL (ref 3.87–5.11)
RDW: 15.5 % (ref 11.5–15.5)
WBC: 14.4 10*3/uL — AB (ref 4.0–10.5)

## 2018-06-03 LAB — TYPE AND SCREEN
ABO/RH(D): A POS
ANTIBODY SCREEN: NEGATIVE

## 2018-06-03 MED ORDER — OXYTOCIN 40 UNITS IN NORMAL SALINE INFUSION - SIMPLE MED: 1.0000 m[IU]/min | INTRAVENOUS | Status: DC

## 2018-06-03 MED ORDER — SOD CITRATE-CITRIC ACID 500-334 MG/5ML PO SOLN: 30.0000 mL | ORAL | Status: DC | PRN

## 2018-06-03 MED ORDER — OXYCODONE-ACETAMINOPHEN 5-325 MG PO TABS: 1.0000 | ORAL_TABLET | ORAL | Status: DC | PRN

## 2018-06-03 MED ORDER — OXYCODONE-ACETAMINOPHEN 5-325 MG PO TABS: 2.0000 | ORAL_TABLET | ORAL | Status: DC | PRN

## 2018-06-03 MED ORDER — LIDOCAINE HCL (PF) 1 % IJ SOLN
30.0000 mL | INTRAMUSCULAR | Status: DC | PRN
  Filled 2018-06-03: qty 30

## 2018-06-03 MED ORDER — ONDANSETRON HCL 4 MG/2ML IJ SOLN
4.0000 mg | Freq: Four times a day (QID) | INTRAMUSCULAR | Status: DC | PRN
  Administered 2018-06-03 – 2018-06-04 (×2): 4 mg via INTRAVENOUS
  Filled 2018-06-03 (×2): qty 2

## 2018-06-03 MED ORDER — MISOPROSTOL 50MCG HALF TABLET
50.0000 ug | ORAL_TABLET | Freq: Once | ORAL | Status: AC
  Administered 2018-06-03: 50 ug via ORAL
  Filled 2018-06-03: qty 1

## 2018-06-03 MED ORDER — HYDROXYZINE HCL 50 MG PO TABS
50.0000 mg | ORAL_TABLET | Freq: Four times a day (QID) | ORAL | Status: DC | PRN
  Filled 2018-06-03: qty 1

## 2018-06-03 MED ORDER — MISOPROSTOL 25 MCG QUARTER TABLET
25.0000 ug | ORAL_TABLET | ORAL | Status: DC | PRN
  Filled 2018-06-03: qty 1

## 2018-06-03 MED ORDER — PHENYLEPHRINE 40 MCG/ML (10ML) SYRINGE FOR IV PUSH (FOR BLOOD PRESSURE SUPPORT)
80.0000 ug | PREFILLED_SYRINGE | INTRAVENOUS | Status: DC | PRN
  Filled 2018-06-03 (×2): qty 10

## 2018-06-03 MED ORDER — LACTATED RINGERS IV SOLN
INTRAVENOUS | Status: DC
  Administered 2018-06-03 – 2018-06-04 (×3): via INTRAVENOUS

## 2018-06-03 MED ORDER — ZOLPIDEM TARTRATE 5 MG PO TABS: 5.0000 mg | ORAL_TABLET | Freq: Every evening | ORAL | Status: DC | PRN

## 2018-06-03 MED ORDER — FENTANYL 2.5 MCG/ML BUPIVACAINE 1/10 % EPIDURAL INFUSION (WH - ANES)
14.0000 mL/h | INTRAMUSCULAR | Status: DC | PRN
  Administered 2018-06-03 – 2018-06-04 (×2): 14 mL/h via EPIDURAL
  Filled 2018-06-03 (×2): qty 100

## 2018-06-03 MED ORDER — SODIUM CHLORIDE 0.9 % IV SOLN
5.0000 10*6.[IU] | Freq: Once | INTRAVENOUS | Status: AC
  Administered 2018-06-03: 5 10*6.[IU] via INTRAVENOUS
  Filled 2018-06-03: qty 5

## 2018-06-03 MED ORDER — FENTANYL CITRATE (PF) 100 MCG/2ML IJ SOLN
50.0000 ug | INTRAMUSCULAR | Status: DC | PRN
  Administered 2018-06-03: 100 ug via INTRAVENOUS
  Filled 2018-06-03: qty 2

## 2018-06-03 MED ORDER — OXYTOCIN BOLUS FROM INFUSION
500.0000 mL | Freq: Once | INTRAVENOUS | Status: AC
  Administered 2018-06-04: 500 mL via INTRAVENOUS

## 2018-06-03 MED ORDER — OXYTOCIN 40 UNITS IN NORMAL SALINE INFUSION - SIMPLE MED
2.5000 [IU]/h | INTRAVENOUS | Status: DC
  Filled 2018-06-03: qty 1000

## 2018-06-03 MED ORDER — TERBUTALINE SULFATE 1 MG/ML IJ SOLN
0.2500 mg | Freq: Once | INTRAMUSCULAR | Status: DC | PRN
  Filled 2018-06-03: qty 1

## 2018-06-03 MED ORDER — LIDOCAINE HCL (PF) 1 % IJ SOLN
INTRAMUSCULAR | Status: DC | PRN
  Administered 2018-06-03 (×2): 5 mL via EPIDURAL

## 2018-06-03 MED ORDER — FLEET ENEMA 7-19 GM/118ML RE ENEM: 1.0000 | ENEMA | RECTAL | Status: DC | PRN

## 2018-06-03 MED ORDER — LACTATED RINGERS IV SOLN
500.0000 mL | INTRAVENOUS | Status: DC | PRN
  Administered 2018-06-03: 500 mL via INTRAVENOUS

## 2018-06-03 MED ORDER — EPHEDRINE 5 MG/ML INJ
10.0000 mg | INTRAVENOUS | Status: DC | PRN
  Filled 2018-06-03: qty 2

## 2018-06-03 MED ORDER — ACETAMINOPHEN 325 MG PO TABS: 650.0000 mg | ORAL_TABLET | ORAL | Status: DC | PRN

## 2018-06-03 MED ORDER — PENICILLIN G 3 MILLION UNITS IVPB - SIMPLE MED
3.0000 10*6.[IU] | INTRAVENOUS | Status: DC
  Administered 2018-06-03 – 2018-06-04 (×5): 3 10*6.[IU] via INTRAVENOUS
  Filled 2018-06-03 (×4): qty 100

## 2018-06-03 MED ORDER — PHENYLEPHRINE 40 MCG/ML (10ML) SYRINGE FOR IV PUSH (FOR BLOOD PRESSURE SUPPORT)
80.0000 ug | PREFILLED_SYRINGE | INTRAVENOUS | Status: DC | PRN
  Administered 2018-06-03: 80 ug via INTRAVENOUS
  Filled 2018-06-03: qty 10

## 2018-06-03 MED ORDER — LACTATED RINGERS IV SOLN: 500.0000 mL | Freq: Once | INTRAVENOUS | Status: DC

## 2018-06-03 MED ORDER — DIPHENHYDRAMINE HCL 50 MG/ML IJ SOLN: 12.5000 mg | INTRAMUSCULAR | Status: DC | PRN

## 2018-06-03 MED ORDER — MISOPROSTOL 50MCG HALF TABLET
50.0000 ug | ORAL_TABLET | ORAL | Status: DC | PRN
  Administered 2018-06-03: 50 ug via BUCCAL
  Filled 2018-06-03: qty 1

## 2018-06-03 NOTE — Anesthesia Pain Management Evaluation Note (Signed)
  CRNA Pain Management Visit Note  Patient: Toni Ball, 28 y.o., female  "Hello [facility] am a [facility]. We have an anesthesia team available at all times to provide care throughout the hospital, including epidural management and anesthesia for C-section. I don't know your plan for the delivery whether it a natural birth, water birth, IV sedation, nitrous supplementation, doula or epidural, but we want to meet your pain goals."   1.Was your pain managed to your expectations on prior hospitalizations?   No prior hospitalizations  2.What is your expectation for pain management during this hospitalization?     Epidural  3.How can we help you reach that goal?   Record the patient's initial score and the patient's pain goal.   Pain: 4  Pain Goal: 10 [facility] wants you to be able to say your pain was always managed very well.  Laban EmperorMalinova,Jayro Mcmath Hristova 06/03/2018

## 2018-06-03 NOTE — Anesthesia Procedure Notes (Signed)
Epidural Patient location during procedure: OB Start time: 06/03/2018 7:11 PM End time: 06/03/2018 7:21 PM  Staffing Anesthesiologist: Leonides Grills, MD Performed: anesthesiologist   Preanesthetic Checklist Completed: patient identified, site marked, pre-op evaluation, timeout performed, IV checked, risks and benefits discussed and monitors and equipment checked  Epidural Patient position: sitting Prep: DuraPrep Patient monitoring: heart rate, cardiac monitor, continuous pulse ox and blood pressure Approach: midline Location: L4-L5 Injection technique: LOR air  Needle:  Needle type: Tuohy  Needle gauge: 17 G Needle length: 9 cm Needle insertion depth: 6 cm Catheter type: closed end flexible Catheter size: 19 Gauge Catheter at skin depth: 11 cm Test dose: negative and Other  Assessment Events: blood not aspirated, injection not painful, no injection resistance and negative IV test  Additional Notes Informed consent obtained prior to proceeding including risk of failure, 1% risk of PDPH, risk of minor discomfort and bruising. Discussed alternatives to epidural analgesia and patient desires to proceed.  Timeout performed pre-procedure verifying patient name, procedure, and platelet count.  Patient tolerated procedure well. Reason for block:procedure for pain

## 2018-06-03 NOTE — Progress Notes (Signed)
[facility]N [facility]: Labor Progress Note  Subjective: Much more uncomfortable. Wants to get an epidural. Trying to do position changes.   Objective: BP 122/74   Pulse 90   Temp 97.7 F (36.5 C) (Oral)   Resp 20   Ht 5' 1.75" (1.568 m)   Wt 83.9 kg   LMP 08/25/2017 (Exact Date)   BMI 34.09 kg/m  Gen: uncomfortable appearing, sitting up in bed  Dilation: 3 Effacement (%): 50 Cervical Position: Posterior, Middle Station: -3 Presentation: Vertex Exam by:: CGoodnight,RNC   Assessment and Plan: 28 y.o. G1P0 [redacted]w[redacted]d here for PDIOL.   Labor: Has received two doses of cytotec, now contracting regularly on own every 1-3 minutes. Written for vaginal cytotec which is now due but planning to get epidural. Since contracting so frequently, will wait on cytotec and recheck cervix after epidural. Then can decide on vaginal cytotec or pitocin.  -- pain control: open to options -- PPH Risk: low  Fetal Well-Being: EFW 7-8lbs by Leopold's. Cephalic by BSUS.  -- Category I - continuous fetal monitoring  -- GBS positive - pcn    Gloriajean Okun S. Earlene Plater, DO OB/GYN Fellow, Faculty Practice  7:12 PM

## 2018-06-03 NOTE — Progress Notes (Signed)
Pt had foley bulb placed in office yesterday afternoon around 1630.  Foley bulb still in place

## 2018-06-03 NOTE — Progress Notes (Signed)
THis nurse tugged on foley bulb once again: still in place unable to remove

## 2018-06-03 NOTE — Anesthesia Preprocedure Evaluation (Signed)
Anesthesia Evaluation  Patient identified by MRN, date of birth, ID band Patient awake    Reviewed: Allergy & Precautions, H&P , NPO status , Patient's Chart, lab work & pertinent test results  History of Anesthesia Complications Negative for: history of anesthetic complications  Airway Mallampati: II  TM Distance: >3 FB Neck ROM: full    Dental no notable dental hx. (+) Teeth Intact   Pulmonary neg pulmonary ROS,    Pulmonary exam normal breath sounds clear to auscultation       Cardiovascular negative cardio ROS Normal cardiovascular exam Rhythm:regular Rate:Normal     Neuro/Psych  Headaches, negative psych ROS   GI/Hepatic negative GI ROS, Neg liver ROS,   Endo/Other  negative endocrine ROS  Renal/GU negative Renal ROS  negative genitourinary   Musculoskeletal   Abdominal (+) + obese,   Peds  Hematology negative hematology ROS (+)   Anesthesia Other Findings   Reproductive/Obstetrics (+) Pregnancy                             Anesthesia Physical Anesthesia Plan  ASA: II  Anesthesia Plan: Epidural   Post-op Pain Management:    Induction:   PONV Risk Score and Plan:   Airway Management Planned:   Additional Equipment:   Intra-op Plan:   Post-operative Plan:   Informed Consent: I have reviewed the patients History and Physical, chart, labs and discussed the procedure including the risks, benefits and alternatives for the proposed anesthesia with the patient or authorized representative who has indicated his/her understanding and acceptance.       Plan Discussed with:   Anesthesia Plan Comments:         Anesthesia Quick Evaluation

## 2018-06-03 NOTE — Progress Notes (Signed)
Notified Foley bulb still in place: was placed yesterday around 1630.  Per Drenda Freeze, hold off on starting pitocin at this time since foley bulb still in.

## 2018-06-03 NOTE — Progress Notes (Signed)
OB/[facility]N Faculty Practice: Labor Progress Note  Subjective: Doing well, occasionally feeling more contractions. Light labor diet. Is trying to switch positions from bed to couch.   Objective: BP 100/63   Pulse 89   Temp 97.7 F (36.5 C) (Oral)   Resp 18   Ht 5' 1.75" (1.568 m)   Wt 83.9 kg   LMP 08/25/2017 (Exact Date)   BMI 34.09 kg/m  Gen: well-appearing, NAD Dilation: 3 Effacement (%): 40, 50 Cervical Position: Posterior Station: -3 Presentation: Vertex Exam by:: CGoodnight,RNC   Assessment and Plan: 28 y.o. G1P0 [redacted]w[redacted]d here for PDIOL.   Labor: Induction started with outpatient FB, received cytotec this morning. Additional dose given around 1430 as still thick cervix with FB out.  -- pain control: open to options -- PPH Risk: low  Fetal Well-Being: EFW 7-8lbs by Leopold's. Cephalic by BSUS.  -- Category I - continuous fetal monitoring  -- GBS positive - pcn    Anisha Starliper S. Earlene Plater, DO OB/GYN Fellow, Faculty Practice  3:03 PM

## 2018-06-03 NOTE — Progress Notes (Signed)
Foley Bulb out

## 2018-06-03 NOTE — H&P (Signed)
Faculty Practice H&P  Melisande Ball is a 28 y.o. female G1P0 with IUP at [redacted]w[redacted]d presenting for IOL for postdates. Pregnancy was been complicated by rubella non-immune status.   Pt states she has been having occasional contractions, but no vaginal bleeding, intact membranes, with normal fetal movement.     Prenatal Course Source of Care: [facility] with onset of care at 11 weeks  Pregnancy complications or risks: Patient Active Problem List   Diagnosis Date Noted  . Pregnant and not yet delivered in third trimester 06/03/2018  . Anemia in pregnancy 03/19/2018  . Rubella non-immune status, antepartum 11/27/2017  . Supervision of normal first pregnancy, antepartum 11/12/2017   She desires oral progesterone-only contraceptive for contraception.  She plans to breastfeed  Prenatal labs and studies: ABO, Rh: --/--/A POS (01/14 0740) Antibody: NEG (01/14 0740) Rubella: <0.90 (07/08 1110) RPR: NON-REACTIVE (10/28 0937)  HBsAg: NON-REACTIVE (07/08 1110)  HIV: NON-REACTIVE (10/28 0937)  GBS: Positive (12/16 0000)  2hr Glucola: negative Genetic screening: normal Anatomy US: normal  Past Medical History:  Past Medical History:  Diagnosis Date  . Allergy   . Migraine     Past Surgical History: History reviewed. No pertinent surgical history.  Obstetrical History:  OB History    Gravida  1   Para      Term      Preterm      AB      Living        SAB      TAB      Ectopic      Multiple      Live Births              Gynecological History:  OB History    Gravida  1   Para      Term      Preterm      AB      Living        SAB      TAB      Ectopic      Multiple      Live Births              Social History:  Social History   Socioeconomic History  . Marital status: Married    Spouse name: Not on file  . Number of children: Not on file  . Years of education: Not on file  . Highest education level: Not on file  Occupational  History  . Not on file  Social Needs  . Financial resource strain: Not hard at all  . Food insecurity:    Worry: Never true    Inability: Never true  . Transportation needs:    Medical: No    Non-medical: Not on file  Tobacco Use  . Smoking status: Never Smoker  . Smokeless tobacco: Never Used  Substance and Sexual Activity  . Alcohol use: Never    Frequency: Never  . Drug use: Never  . Sexual activity: Yes  Lifestyle  . Physical activity:    Days per week: Not on file    Minutes per session: Not on file  . Stress: To some extent  Relationships  . Social connections:    Talks on phone: Not on file    Gets together: Not on file    Attends religious service: Not on file    Active member of club or organization: Not on file    Attends meetings of clubs or organizations: Not on file  Relationship status: Not on file  Other Topics Concern  . Not on file  Social History Narrative  . Not on file    Family History:  Family History  Problem Relation Age of Onset  . Cancer Mother   . Hypertension Mother   . Asthma Mother   . Hyperlipidemia Father   . Hypertension Father   . Asthma Father   . Hypertension Maternal Grandmother   . Hearing loss Maternal Grandmother   . Hypertension Maternal Grandfather   . Diabetes Paternal Grandmother   . Hypertension Paternal Grandmother   . Hypertension Paternal Grandfather   . Stroke Neg Hx     Medications:  Prenatal vitamins,  Current Facility-Administered Medications  Medication Dose Route Frequency Provider Last Rate Last Dose  . acetaminophen (TYLENOL) tablet 650 mg  650 mg Oral Q4H PRN Jacklyn Shellresenzo-Dishmon, Frances, CNM      . fentaNYL (SUBLIMAZE) injection 50-100 mcg  50-100 mcg Intravenous Q1H PRN Jacklyn Shellresenzo-Dishmon, Frances, CNM      . hydrOXYzine (ATARAX/VISTARIL) tablet 50 mg  50 mg Oral Q6H PRN Jacklyn Shellresenzo-Dishmon, Frances, CNM      . lactated ringers infusion 500-1,000 mL  500-1,000 mL Intravenous PRN Jacklyn Shellresenzo-Dishmon,  Frances, CNM      . lactated ringers infusion   Intravenous Continuous Jacklyn ShellCresenzo-Dishmon, Frances, CNM 125 mL/hr at 06/03/18 0805    . lidocaine (PF) (XYLOCAINE) 1 % injection 30 mL  30 mL Subcutaneous PRN Jacklyn Shellresenzo-Dishmon, Frances, CNM      . misoprostol (CYTOTEC) tablet 50 mcg  50 mcg Oral Once Levie HeritageStinson, Jacob J, DO      . ondansetron [facility](ZOFRAN) injection 4 mg  4 mg Intravenous Q6H PRN Jacklyn Shellresenzo-Dishmon, Frances, CNM      . oxyCODONE-acetaminophen (PERCOCET/ROXICET) 5-325 MG per tablet 1 tablet  1 tablet Oral Q4H PRN Jacklyn Shellresenzo-Dishmon, Frances, CNM      . oxyCODONE-acetaminophen (PERCOCET/ROXICET) 5-325 MG per tablet 2 tablet  2 tablet Oral Q4H PRN Jacklyn Shellresenzo-Dishmon, Frances, CNM      . oxytocin (PITOCIN) IV BOLUS FROM BAG  500 mL Intravenous Once Jacklyn Shellresenzo-Dishmon, Frances, CNM      . oxytocin (PITOCIN) IV infusion 40 units in NS 1000 mL - Premix  1-40 milli-units/min Intravenous Titrated Jacklyn Shellresenzo-Dishmon, Frances, CNM      . oxytocin (PITOCIN) IV infusion 40 units in NS 1000 mL - Premix  2.5 Units/hr Intravenous Continuous Jacklyn Shellresenzo-Dishmon, Frances, CNM      . penicillin G 3 million units in sodium chloride 0.9% 100 mL IVPB  3 Million Units Intravenous Q4H Cresenzo-Dishmon, Scarlette CalicoFrances, CNM      . sodium citrate-citric acid (ORACIT) solution 30 mL  30 mL Oral Q2H PRN Jacklyn Shellresenzo-Dishmon, Frances, CNM      . sodium phosphate (FLEET) 7-19 GM/118ML enema 1 enema  1 enema Rectal PRN Jacklyn Shellresenzo-Dishmon, Frances, CNM      . terbutaline (BRETHINE) injection 0.25 mg  0.25 mg Subcutaneous Once PRN Jacklyn Shellresenzo-Dishmon, Frances, CNM      . zolpidem (AMBIEN) tablet 5 mg  5 mg Oral QHS PRN Jacklyn Shellresenzo-Dishmon, Frances, CNM        Allergies:  Allergies  Allergen Reactions  . Peanut-Containing Drug Products Anaphylaxis  . Cinnamon Other (See Comments)  . Eggs Or Egg-Derived Products   . Milk-Related Compounds     Review of Systems: - negative  Physical Exam: Blood pressure 107/70, pulse 91, temperature 97.8 F (36.6 C),  temperature source Oral, resp. rate 16, last menstrual period 08/25/2017. GENERAL: Well-developed, well-nourished female in no acute distress.  LUNGS: Clear to auscultation bilaterally.  HEART: Regular rate and rhythm. ABDOMEN: Soft, nontender, nondistended, gravid. EFW 8 lbs EXTREMITIES: Nontender, no edema, 2+ distal pulses. Cervical Exam: Dilatation 1.5 cm   Unable to estimate effacement due to foley balloon still in place.   Presentation: cephalic - confirmed by [place] FHT:  Baseline rate 130s-140s bpm   Variability moderate  Accelerations present   Decelerations none Contractions: Every 2-5 mins   Pertinent Labs/Studies:   Lab Results  Component Value Date   WBC 14.4 (H) 06/03/2018   HGB 12.4 06/03/2018   HCT 37.9 06/03/2018   MCV 89.8 06/03/2018   PLT 254 06/03/2018    Assessment : Eugene Prosperi is a 28 y.o. G1P0 at [redacted]w[redacted]d being admitted for IOL due to postdates.  Plan: Foley balloon still in place. Will give cytotec buccally, then start pitocin in 4 hours.   PCN for GBS prophylaxis POP for contraception Intends on breastfeeding.    Levie Heritage, DO 06/03/2018, 9:59 AM

## 2018-06-04 ENCOUNTER — Encounter (HOSPITAL_COMMUNITY): Payer: Self-pay

## 2018-06-04 DIAGNOSIS — O480 Post-term pregnancy: Secondary | ICD-10-CM

## 2018-06-04 DIAGNOSIS — Z3A40 40 weeks gestation of pregnancy: Secondary | ICD-10-CM

## 2018-06-04 MED ORDER — ONDANSETRON HCL 4 MG/2ML IJ SOLN: 4.0000 mg | INTRAMUSCULAR | Status: DC | PRN

## 2018-06-04 MED ORDER — FLEET ENEMA 7-19 GM/118ML RE ENEM: 1.0000 | ENEMA | Freq: Every day | RECTAL | Status: DC | PRN

## 2018-06-04 MED ORDER — SODIUM CHLORIDE 0.9% FLUSH: 3.0000 mL | Freq: Two times a day (BID) | INTRAVENOUS | Status: DC

## 2018-06-04 MED ORDER — SODIUM CHLORIDE 0.9% FLUSH: 3.0000 mL | INTRAVENOUS | Status: DC | PRN

## 2018-06-04 MED ORDER — WITCH HAZEL-GLYCERIN EX PADS: 1.0000 "application " | MEDICATED_PAD | CUTANEOUS | Status: DC | PRN

## 2018-06-04 MED ORDER — BISACODYL 10 MG RE SUPP: 10.0000 mg | Freq: Every day | RECTAL | Status: DC | PRN

## 2018-06-04 MED ORDER — DIPHENHYDRAMINE HCL 25 MG PO CAPS: 25.0000 mg | ORAL_CAPSULE | Freq: Four times a day (QID) | ORAL | Status: DC | PRN

## 2018-06-04 MED ORDER — ONDANSETRON HCL 4 MG PO TABS: 4.0000 mg | ORAL_TABLET | ORAL | Status: DC | PRN

## 2018-06-04 MED ORDER — ACETAMINOPHEN 325 MG PO TABS: 650.0000 mg | ORAL_TABLET | ORAL | Status: DC | PRN

## 2018-06-04 MED ORDER — MEASLES, MUMPS & RUBELLA VAC IJ SOLR
0.5000 mL | Freq: Once | INTRAMUSCULAR | Status: DC
  Filled 2018-06-04: qty 0.5

## 2018-06-04 MED ORDER — DIBUCAINE 1 % RE OINT: 1.0000 "application " | TOPICAL_OINTMENT | RECTAL | Status: DC | PRN

## 2018-06-04 MED ORDER — PRENATAL MULTIVITAMIN CH
1.0000 | ORAL_TABLET | Freq: Every day | ORAL | Status: DC
  Administered 2018-06-04 – 2018-06-05 (×2): 1 via ORAL
  Filled 2018-06-04 (×3): qty 1

## 2018-06-04 MED ORDER — SIMETHICONE 80 MG PO CHEW: 80.0000 mg | CHEWABLE_TABLET | ORAL | Status: DC | PRN

## 2018-06-04 MED ORDER — IBUPROFEN 600 MG PO TABS
600.0000 mg | ORAL_TABLET | Freq: Four times a day (QID) | ORAL | Status: DC
  Administered 2018-06-04 – 2018-06-06 (×8): 600 mg via ORAL
  Filled 2018-06-04 (×9): qty 1

## 2018-06-04 MED ORDER — ZOLPIDEM TARTRATE 5 MG PO TABS: 5.0000 mg | ORAL_TABLET | Freq: Every evening | ORAL | Status: DC | PRN

## 2018-06-04 MED ORDER — COCONUT OIL OIL
1.0000 "application " | TOPICAL_OIL | Status: DC | PRN
  Filled 2018-06-04: qty 120

## 2018-06-04 MED ORDER — SENNOSIDES-DOCUSATE SODIUM 8.6-50 MG PO TABS
2.0000 | ORAL_TABLET | ORAL | Status: DC
  Administered 2018-06-05 (×2): 2 via ORAL
  Filled 2018-06-04 (×2): qty 2

## 2018-06-04 MED ORDER — BENZOCAINE-MENTHOL 20-0.5 % EX AERO
1.0000 "application " | INHALATION_SPRAY | CUTANEOUS | Status: DC | PRN
  Administered 2018-06-04: 1 via TOPICAL
  Filled 2018-06-04: qty 56

## 2018-06-04 MED ORDER — SODIUM CHLORIDE 0.9 % IV SOLN: 250.0000 mL | INTRAVENOUS | Status: DC | PRN

## 2018-06-04 MED ORDER — TETANUS-DIPHTH-ACELL PERTUSSIS 5-2.5-18.5 LF-MCG/0.5 IM SUSP: 0.5000 mL | Freq: Once | INTRAMUSCULAR | Status: DC

## 2018-06-04 NOTE — Lactation Note (Signed)
This note was copied from a baby's chart. Lactation Consultation Note  Patient Name: Toni Ball ZOXWR'UToday's Date: 06/04/2018 Reason for consult: Initial assessment;Term;Primapara;1st time breastfeeding  6113 hours old FT female who is being exclusively BF by her mother, she's a P1. She's already familiar with hand expression and able to get some droplets of colostrum; praised her for her efforts. Mom didn't take any BF classes but she's knowledgeable in some aspects, and she's already baking some lactation cookies. Noticed that her nipples are flat/short shafted, but her tissue is very compressible, RN had already set mom up with a hand pump but she doesn't like to use it because the suction is "too much". Explained to mom that the purpose of the hand pump is mainly to help evert the nipples. LC also set mom up with breast shells, she brought a nursing bra to the hospital, instructions, cleaning and storage were reviewed.   Mom is a [employer] employee, she works at [employer]. She inquired about her employee DEBP but she claims that she doesn't have her insurance card, that [employer] insurance company never sent it to her, she says her card was sent "electronically" to her e-mail. Explained to mom that due to HIPAA regulations we need to get the physical insurance card and make a copy of it. Mom voiced she'll log into her e-mail and print the card, and once she has the print out ready, she'll hand it to the [facility] on duty to get her employee pump. LC noticed parents had a pacifier in the room, educated parents how pacifiers could hurt breastfeeding when it's not yet fully established.  LC offered assistance with latch, and mom agreed to wake baby up to feed. LC took baby STS to mom's right breast in football position but baby unable to latch, she won't even suck on a finger, even though she was in her quiet alert state, then she started crying. Mom took baby to her chest and kept her STS. An attempt was  documented in Flowsheets. Parents very involved during [facility] consultation and had lots of questions. Discussed normal newborn behavior, baby's sleeping cycle, cluster feeding and lactogenesis II.  Feeding diary:  1. Encouraged mom to feed baby STS 8-12 times/24 hours or sooner if feeding cues are present 2. Hand expression and spoon/finger feeding was also encouraged  BF brochure, BF resources and feeding diary were reviewed. Parents reported all questions and concerns were answered, they're both aware of LC services and will call PRN.  Maternal Data Formula Feeding for Exclusion: No Has patient been taught Hand Expression?: Yes Does the patient have breastfeeding experience prior to this delivery?: No  Feeding Feeding Type: Breast Fed  Interventions Interventions: Breast feeding basics reviewed;Assisted with latch;Skin to skin;Breast massage;Hand express;Breast compression;Adjust position;Support pillows;Shells;Hand pump;Reverse pressure  Lactation Tools Discussed/Used Tools: Pump;Shells Shell Type: Inverted Breast pump type: Manual WIC Program: No Pump Review: Setup, frequency, and cleaning Initiated by:: RN Date initiated:: 06/04/18   Consult Status Consult Status: Follow-up Date: 06/05/18 Follow-up type: In-patient    Rutherford Alarie Venetia ConstableS Mckinnon Glick 06/04/2018, 9:40 PM

## 2018-06-04 NOTE — Progress Notes (Signed)
Patient ID: Toni Ball, female   DOB: 1991-05-19, 28 y.o.   MRN: [id number] Toni Ball is a 28 y.o. G1P0 at [redacted]w[redacted]d admitted for induction of labor due to Elective at term.  Subjective: Feeling some pressure  Objective: BP 107/60   Pulse 76   Temp 98.1 F (36.7 C) (Oral)   Resp 16   Ht 5' 1.75" (1.568 m)   Wt 83.9 kg   LMP 08/25/2017 (Exact Date)   SpO2 97%   BMI 34.09 kg/m  No intake/output data recorded.  FHT:  FHR: 140 bpm, variability: moderate,  accelerations:  Present,  decelerations:  Absent UC:   regular, every 2 minutes  SVE:   Dilation: 10 Effacement (%): 80 Station: Plus 1, Plus 2 Exam by:: Genella Rife CNM  Labs: Lab Results  Component Value Date   WBC 14.4 (H) 06/03/2018   HGB 12.4 06/03/2018   HCT 37.9 06/03/2018   MCV 89.8 06/03/2018   PLT 254 06/03/2018    Assessment / Plan: IOL d/t term elective, now complete, will rest a little/labor down, and then let us know when she's ready to push  Labor: Progressing normally Fetal Wellbeing:  Category I Pain Control:  Epidural Pre-eclampsia: n/a I/D:  n/a Anticipated MOD:  NSVD  Toni Ball CNM, WHNP-BC 06/04/2018, 7:28 AM

## 2018-06-04 NOTE — Progress Notes (Signed)
Patient ID: Toni Ball, female   DOB: 06-09-1990, 28 y.o.   MRN: [id number] Toni Ball is a 28 y.o. G1P0 at [redacted]w[redacted]d admitted for induction of labor due to Elective at term.  Subjective: No complaints  Objective: BP 115/63   Pulse 80   Temp 97.9 F (36.6 C) (Oral)   Resp 16   Ht 5' 1.75" (1.568 m)   Wt 83.9 kg   LMP 08/25/2017 (Exact Date)   SpO2 97%   BMI 34.09 kg/m  No intake/output data recorded.  FHT:  FHR: 150 bpm, variability: moderate,  accelerations:  Present,  decelerations:  Absent UC:   q 2-29mins  SVE:   3.5/50/-3 @ 2000  Labs: Lab Results  Component Value Date   WBC 14.4 (H) 06/03/2018   HGB 12.4 06/03/2018   HCT 37.9 06/03/2018   MCV 89.8 06/03/2018   PLT 254 06/03/2018    Assessment / Plan: IOL d/t elective at term, s/p cytotec and foley bulb, will monitor for need for pitocin  Labor: Progressing normally Fetal Wellbeing:  Category I Pain Control:  Epidural Pre-eclampsia: n/a I/D:  PCN for GBS+ Anticipated MOD:  NSVD  DORETHIA MORANO CNM, [facility] 06/03/2018

## 2018-06-04 NOTE — Progress Notes (Signed)
Patient ID: Toni Ball, female   DOB: [birth year], 28 y.o.   MRN: [id number] Toni Ball is a 28 y.o. G1P0 at [redacted]w[redacted]d admitted for induction of labor due to Elective at term.  Subjective: Doing well  Objective: BP (!) 105/56   Pulse 87   Temp 97.9 F (36.6 C) (Oral)   Resp 16   Ht 5' 1.75" (1.568 m)   Wt 83.9 kg   LMP 08/25/2017 (Exact Date)   SpO2 97%   BMI 34.09 kg/m  Total I/O In: -  Out: 700 [Urine:700]  FHT:  FHR: 145 bpm, variability: moderate,  accelerations:  Present,  decelerations:  Absent UC:   regular, every 2-3 minutes  SVE:   Dilation: 7.5 Effacement (%): 80 Station: -1, 0 Exam by:: Gearldine Bienenstock, RN  SROM @ [id number]  Labs: Lab Results  Component Value Date   WBC 14.4 (H) 06/03/2018   HGB 12.4 06/03/2018   HCT 37.9 06/03/2018   MCV 89.8 06/03/2018   PLT 254 06/03/2018    Assessment / Plan: IOL d/t elective term, s/p foley bulb and cytotec x 2, SROM'd, progressing well, not on pitocin  Labor: Progressing normally Fetal Wellbeing:  Category I Pain Control:  Epidural Pre-eclampsia: n/a I/D:  PCN for GBS+ Anticipated MOD:  NSVD  JENAE MEADER CNM, WHNP-BC 06/04/2018, 5:33 AM

## 2018-06-04 NOTE — Anesthesia Postprocedure Evaluation (Signed)
Anesthesia Post Note  Patient: Toni Ball  Procedure(s) Performed: AN AD HOC LABOR EPIDURAL     Patient location during evaluation: Mother Baby Anesthesia Type: Epidural Level of consciousness: awake, awake and alert and oriented Pain management: pain level controlled Vital Signs Assessment: post-procedure vital signs reviewed and stable Respiratory status: spontaneous breathing and respiratory function stable Cardiovascular status: blood pressure returned to baseline Postop Assessment: no headache, no backache, epidural receding, patient able to bend at knees, no apparent nausea or vomiting, adequate PO intake and able to ambulate Anesthetic complications: no    Last Vitals:  Vitals:   06/04/18 1030 06/04/18 1130  BP: 104/61 107/64  Pulse: 98 87  Resp: 18 16  Temp: 37 C   SpO2:      Last Pain:  Vitals:   06/04/18 1400  TempSrc:   PainSc: 0-No pain   Pain Goal: Patients Stated Pain Goal: 8 (06/03/18 0746)              @ANFLOW60MIN (12500)  )Cleda Clarks

## 2018-06-04 NOTE — Discharge Summary (Addendum)
  Postpartum Discharge Summary     Patient Name: Toni Ball DOB: 01/07/1991 MRN: [id number]  Date of admission: 06/03/2018 Delivering Provider: DELOGLOS, STEPHENIA M   Date of discharge: 06/06/2018  Admitting diagnosis: 40WKS INDUCTION Intrauterine pregnancy: [redacted]w[redacted]d     Secondary diagnosis:  Active Problems:   Supervision of normal first pregnancy, antepartum   Rubella non-immune status, antepartum   Anemia in pregnancy  Additional problems: none, term elective IOL     Discharge diagnosis: Term Pregnancy Delivered                                                                                                Post partum procedures: offered MMR prior to discharge  Augmentation: Cytotec and Foley Balloon  Complications: None  Hospital course:  Induction of Labor With Vaginal Delivery   28 y.o. yo G1P0 at [redacted]w[redacted]d was admitted to the hospital 06/03/2018 for induction of labor.  Indication for induction: term elective .  Patient had an uncomplicated labor course as follows: Membrane Rupture Time/Date: 4:05 AM ,06/04/2018   Intrapartum Procedures: Episiotomy: None [1]                                         Lacerations:  None [1]  Patient had delivery of a Viable infant.  Information for the patient's newborn:  Hollar, Girl Darrien [[id number]]  Delivery Method: Vaginal, Spontaneous(Filed from Delivery Summary)   06/04/2018  Details of delivery can be found in separate delivery note.  Patient had a routine postpartum course. Patient is discharged home 06/06/18.  Magnesium Sulfate recieved: No BMZ received: No  Physical exam  Vitals:   06/05/18 0520 06/05/18 1448 06/05/18 2302 06/06/18 0520  BP: 100/66 119/73 118/81 115/78  Pulse: 86 97 81 79  Resp: 16 16 20 18  Temp: 97.8 F (36.6 C) 98.1 F (36.7 C) 98 F (36.7 C) 97.9 F (36.6 C)  TempSrc: Oral Oral Oral Oral  SpO2: 99%   99%  Weight:      Height:       General: alert and cooperative Lochia: appropriate Uterine  Fundus: firm Incision: N/A DVT Evaluation: No evidence of DVT seen on physical exam. Labs: Lab Results  Component Value Date   WBC 14.4 (H) 06/03/2018   HGB 12.4 06/03/2018   HCT 37.9 06/03/2018   MCV 89.8 06/03/2018   PLT 254 06/03/2018   No flowsheet data found.  Discharge instruction: per After Visit Summary and "Baby and Me Booklet".  After visit meds:  Allergies as of 06/06/2018      Reactions   Peanut-containing Drug Products Anaphylaxis   Cinnamon Other (See Comments)      Medication List    STOP taking these medications   EPINEPHrine 0.3 mg/0.3 mL Soaj injection Commonly known as:  EPI-PEN   ferrous sulfate 325 (65 FE) MG tablet   hydrocortisone 1 % ointment     TAKE these medications   ibuprofen 600 MG tablet Commonly known as:  ADVIL,MOTRIN Take 1 tablet (600   mg total) by mouth every 6 (six) hours as needed.   PRENATAL VITAMIN PO Take 1 tablet by mouth daily.       Diet: routine diet  Activity: Advance as tolerated. Pelvic rest for 6 weeks.   Outpatient follow up:4 weeks Follow up Appt: Future Appointments  Date Time Provider Department Center  07/07/2018  1:00 PM Leggett, Kelly H, MD [facility]   Follow up Visit: Follow-up Information    [facility] Follow up.   Specialty:  Obstetrics and Gynecology Why:  Keep your postpartum appintment at [place] and call if you need anything before then. Contact information: [address], [address] [place] [address] [phone]           Please schedule this patient for Postpartum visit in: 6 weeks with the following provider: Any provider For C/S patients schedule nurse incision check in weeks 2 weeks: no Low risk pregnancy complicated by: none Delivery mode:  SVD Anticipated Birth Control:  POPs PP Procedures needed: none  Schedule Integrated BH visit: no      Newborn Data: Live born female  Birth Weight: 3836gm   APGAR: 9, 9  Newborn Delivery   Birth date/time:  06/04/2018 08:36:00 Delivery type:  Vaginal, Spontaneous     Baby Feeding: Breast Disposition:home with mother   06/06/2018 Kaitland D Shaw, CNM  10:09 AM    

## 2018-06-04 NOTE — Progress Notes (Signed)
Patient ID: Toni Ball, female   DOB: May 17, 1991, 28 y.o.   MRN: [id number] Toni Ball is a 28 y.o. G1P0 at [redacted]w[redacted]d admitted for induction of labor due to Elective at term.  Subjective: Doing well  Objective: BP 115/63   Pulse 80   Temp 97.9 F (36.6 C) (Oral)   Resp 16   Ht 5' 1.75" (1.568 m)   Wt 83.9 kg   LMP 08/25/2017 (Exact Date)   SpO2 97%   BMI 34.09 kg/m  No intake/output data recorded.  FHT:  FHR: 145 bpm, variability: moderate,  accelerations:  Present,  decelerations:  Absent UC:   regular, every 2-3 minutes  SVE:   Dilation: 6 Effacement (%): 70 Station: -2, -1 Exam by:: s seagraves rn   Labs: Lab Results  Component Value Date   WBC 14.4 (H) 06/03/2018   HGB 12.4 06/03/2018   HCT 37.9 06/03/2018   MCV 89.8 06/03/2018   PLT 254 06/03/2018    Assessment / Plan: IOL d/t elective term, s/p cytotec and FB, progressing well on her own; will monitor for need for augmentation w/ pit  Labor: Progressing normally Fetal Wellbeing:  Category I Pain Control:  Epidural Pre-eclampsia: n/a I/D:  PCN for GBS+ Anticipated MOD:  NSVD  PATSY HERSHEY CNM, WHNP-BC 06/04/2018, 0200

## 2018-06-05 NOTE — Lactation Note (Signed)
This note was copied from a baby's chart. Lactation Consultation Note  Patient Name: Toni Ball [id number] Date: 06/05/2018 Reason for consult: Follow-up assessment  Infant is now 49 hours old. Infant has only fed 1-2 times since birth. Infant observed to only latch intermittently with the teacup hold. Hand expression was taught to Mom, and the resulting (almost 78mL) was spoon-fed to infant. Mom has been pumping, but has not gotten anything.   Parents amenable to offering formula. Cup feeding was tried initially, but infant did not cup feed well. Finger-feeding was done with a curved-tip syringe. Infant took a total of 7 mL. Infant fell asleep after blood draw.  Mom has still not eaten breakfast. I asked Mom to call me once she has eaten & we will work on pumping.   [name] [facility] 06/05/2018, 12:17 PM

## 2018-06-05 NOTE — Lactation Note (Signed)
This note was copied from a baby's chart. Lactation Consultation Note  Patient Name: Toni Ball [id number] Date: 06/05/2018 Reason for consult: Follow-up assessment  Infant was able to latch to the breast with a nipple shield & being supplemented with a 5 Fr. Infant did well with a size 24 nipple shield.  When pumping, Mom felt comfortable with a size 21 flange on her R breast & a size 24 on her L breast (it appeared that a size 27 flange would be more comfortable, but Mom did not find the size 27 comfortable).   Note: Mom reports a hx of PCOS and having been told that she would not be able to conceive. Mom's anatomy & veinage suggest that she will be able to have an adequate supply.   Mom to pump after feedings.   [name] [facility] 06/05/2018, 1:28 PM

## 2018-06-05 NOTE — Progress Notes (Signed)
Post Partum Day 1 Subjective: no complaints, up ad lib, voiding and tolerating PO  Objective: Blood pressure 119/73, pulse 97, temperature 98.1 F (36.7 C), temperature source Oral, resp. rate 16, height 5' 1.75" (1.568 m), weight 83.9 kg, last menstrual period 08/25/2017, SpO2 99 %, unknown if currently breastfeeding.  Physical Exam:  General: alert, cooperative and no distress Lochia: appropriate Uterine Fundus: firm Incision: n/a DVT Evaluation: No evidence of DVT seen on physical exam.  Recent Labs    06/03/18 0740  HGB 12.4  HCT 37.9    Assessment/Plan: Plan for discharge tomorrow and Breastfeeding   LOS: 2 days   Sharen Counter 06/05/2018, 6:55 PM

## 2018-06-06 MED ORDER — IBUPROFEN 600 MG PO TABS: 600.0000 mg | ORAL_TABLET | Freq: Four times a day (QID) | ORAL | 0 refills | Status: AC | PRN

## 2018-06-06 NOTE — Lactation Note (Signed)
This note was copied from a baby's chart. Lactation Consultation Note  Patient Name: Girl Breslin Soung OACZY'S Date: 06/06/2018 Reason for consult: Follow-up assessment;Term;Primapara Baby has been bottle fed formula during the night.  She is 51 hours old and at a 4% weight loss.  RN assisted with latching baby this morning. Baby currently sleeping at breast.  Mom is able to hand express colostrum into baby's mouth.  Breasts are filling. Assisted mom with pumping.  Instructed to pump every 3 hours to establish and maintain milk supply.  Mom has a Medela freestyle pump for home use.  Instructed to call for latch assist when baby is ready to feed.  Maternal Data    Feeding Feeding Type: Breast Milk  LATCH Score                   Interventions    Lactation Tools Discussed/Used     Consult Status      Huston Foley 06/06/2018, 9:51 AM

## 2018-06-06 NOTE — Discharge Instructions (Signed)
Vaginal Delivery, Care After °Refer to this sheet in the next few weeks. These instructions provide you with information about caring for yourself after vaginal delivery. Your health care provider may also give you more specific instructions. Your treatment has been planned according to current medical practices, but problems sometimes occur. Call your health care provider if you have any problems or questions. °What can I expect after the procedure? °After vaginal delivery, it is common to have: °· Some bleeding from your vagina. °· Soreness in your abdomen, your vagina, and the area of skin between your vaginal opening and your anus (perineum). °· Pelvic cramps. °· Fatigue. °Follow these instructions at home: °Medicines °· Take over-the-counter and prescription medicines only as told by your health care provider. °· If you were prescribed an antibiotic medicine, take it as told by your health care provider. Do not stop taking the antibiotic until it is finished. °Driving ° °· Do not drive or operate heavy machinery while taking prescription pain medicine. °· Do not drive for 24 hours if you received a sedative. °Lifestyle °· Do not drink alcohol. This is especially important if you are breastfeeding or taking medicine to relieve pain. °· Do not use tobacco products, including cigarettes, chewing tobacco, or e-cigarettes. If you need help quitting, ask your health care provider. °Eating and drinking °· Drink at least 8 eight-ounce glasses of water every day unless you are told not to by your health care provider. If you choose to breastfeed your baby, you may need to drink more water than this. °· Eat high-fiber foods every day. These foods may help prevent or relieve constipation. High-fiber foods include: °? Whole grain cereals and breads. °? Brown rice. °? Beans. °? Fresh fruits and vegetables. °Activity °· Return to your normal activities as told by your health care provider. Ask your health care provider what  activities are safe for you. °· Rest as much as possible. Try to rest or take a nap when your baby is sleeping. °· Do not lift anything that is heavier than your baby or 10 lb (4.5 kg) until your health care provider says that it is safe. °· Talk with your health care provider about when you can engage in sexual activity. This may depend on your: °? Risk of infection. °? Rate of healing. °? Comfort and desire to engage in sexual activity. °Vaginal Care °· If you have an episiotomy or a vaginal tear, check the area every day for signs of infection. Check for: °? More redness, swelling, or pain. °? More fluid or blood. °? Warmth. °? Pus or a bad smell. °· Do not use tampons or douches until your health care provider says this is safe. °· Watch for any blood clots that may pass from your vagina. These may look like clumps of dark red, brown, or black discharge. °General instructions °· Keep your perineum clean and dry as told by your health care provider. °· Wear loose, comfortable clothing. °· Wipe from front to back when you use the toilet. °· Ask your health care provider if you can shower or take a bath. If you had an episiotomy or a perineal tear during labor and delivery, your health care provider may tell you not to take baths for a certain length of time. °· Wear a bra that supports your breasts and fits you well. °· If possible, have someone help you with household activities and help care for your baby for at least a few days after you   leave the hospital. °· Keep all follow-up visits for you and your baby as told by your health care provider. This is important. °Contact a health care provider if: °· You have: °? Vaginal discharge that has a bad smell. °? Difficulty urinating. °? Pain when urinating. °? A sudden increase or decrease in the frequency of your bowel movements. °? More redness, swelling, or pain around your episiotomy or vaginal tear. °? More fluid or blood coming from your episiotomy or vaginal  tear. °? Pus or a bad smell coming from your episiotomy or vaginal tear. °? A fever. °? A rash. °? Little or no interest in activities you used to enjoy. °? Questions about caring for yourself or your baby. °· Your episiotomy or vaginal tear feels warm to the touch. °· Your episiotomy or vaginal tear is separating or does not appear to be healing. °· Your breasts are painful, hard, or turn red. °· You feel unusually sad or worried. °· You feel nauseous or you vomit. °· You pass large blood clots from your vagina. If you pass a blood clot from your vagina, save it to show to your health care provider. Do not flush blood clots down the toilet without having your health care provider look at them. °· You urinate more than usual. °· You are dizzy or light-headed. °· You have not breastfed at all and you have not had a menstrual period for 12 weeks after delivery. °· You have stopped breastfeeding and you have not had a menstrual period for 12 weeks after you stopped breastfeeding. °Get help right away if: °· You have: °? Pain that does not go away or does not get better with medicine. °? Chest pain. °? Difficulty breathing. °? Blurred vision or spots in your vision. °? Thoughts about hurting yourself or your baby. °· You develop pain in your abdomen or in one of your legs. °· You develop a severe headache. °· You faint. °· You bleed from your vagina so much that you fill two sanitary pads in one hour. °This information is not intended to replace advice given to you by your health care provider. Make sure you discuss any questions you have with your health care provider. °Document Released: 05/04/2000 Document Revised: 10/19/2015 Document Reviewed: 05/22/2015 °Elsevier Interactive Patient Education © 2019 Elsevier Inc. ° °

## 2018-07-02 NOTE — Progress Notes (Signed)
   PRENATAL VISIT NOTE  Subjective:  Toni Ball is a 28 y.o. G1P1001 at [redacted]w[redacted]d being seen today for ongoing prenatal care.  She is currently monitored for the following issues for this low-risk pregnancy and has Supervision of normal first pregnancy, antepartum; Rubella non-immune status, antepartum; Anemia in pregnancy; and Pregnant and not yet delivered in third trimester on their problem list.  Patient reports no complaints.  Contractions: Irritability. Vag. Bleeding: None.  Movement: Present. Denies leaking of fluid.   The following portions of the patient's history were reviewed and updated as appropriate: allergies, current medications, past family history, past medical history, past social history, past surgical history and problem list. Problem list updated.  Objective:   Vitals:   05/19/18 0824  BP: 126/80  Pulse: (!) 101  Weight: 184 lb (83.5 kg)    Fetal Status: Fetal Heart Rate (bpm): 140   Movement: Present     General:  Alert, oriented and cooperative. Patient is in no acute distress.  Skin: Skin is warm and dry. No rash noted.   Cardiovascular: Normal heart rate noted  Respiratory: Normal respiratory effort, no problems with respiration noted  Abdomen: Soft, gravid, appropriate for gestational age.  Pain/Pressure: Present     Pelvic: Cervical exam deferred        Extremities: Normal range of motion.  Edema: Trace  Mental Status: Normal mood and affect. Normal behavior. Normal judgment and thought content.   Assessment and Plan:  Pregnancy: G1P1001 at [redacted]w[redacted]d  1. Supervision of normal first pregnancy, antepartum   Term labor symptoms and general obstetric precautions including but not limited to vaginal bleeding, contractions, leaking of fluid and fetal movement were reviewed in detail with the patient. Please refer to After Visit Summary for other counseling recommendations.  Return in about 1 week (around 05/26/2018).  Future Appointments  Date Time Provider  Department Center  07/07/2018  1:00 PM Lesly Dukes, MD [id number]    Allie Bossier, MD

## 2018-07-07 ENCOUNTER — Ambulatory Visit (INDEPENDENT_AMBULATORY_CARE_PROVIDER_SITE_OTHER): Payer: No Typology Code available for payment source | Admitting: Obstetrics & Gynecology

## 2018-07-07 ENCOUNTER — Encounter: Payer: Self-pay | Admitting: Obstetrics & Gynecology

## 2018-07-07 VITALS — BP 113/73 | HR 96 | Resp 16 | Ht 63.0 in | Wt 161.0 lb

## 2018-07-07 DIAGNOSIS — Z1389 Encounter for screening for other disorder: Secondary | ICD-10-CM

## 2018-07-07 DIAGNOSIS — O99345 Other mental disorders complicating the puerperium: Secondary | ICD-10-CM

## 2018-07-07 DIAGNOSIS — F53 Postpartum depression: Secondary | ICD-10-CM

## 2018-07-07 MED ORDER — NORETHINDRONE 0.35 MG PO TABS: 1.0000 | ORAL_TABLET | Freq: Every day | ORAL | 11 refills | Status: DC

## 2018-07-07 MED ORDER — MEDROXYPROGESTERONE ACETATE 150 MG/ML IM SUSP: 150.0000 mg | INTRAMUSCULAR | 0 refills | Status: DC

## 2018-07-07 NOTE — Progress Notes (Signed)
Post Partum Exam  Toni Ball is a 28 y.o. G25P1001 female who presents for a postpartum visit. She is 4 weeks postpartum following a spontaneous vaginal delivery. I have fully reviewed the prenatal and intrapartum course. The delivery was at [redacted]w[redacted]d gestational weeks.  Pt did have a 1st degree periurethral laceration. Anesthesia: epidural. Postpartum course has been unremarkable. Baby's course has been unremarkable. Baby is feeding by breast. Bleeding brown. Bowel function is normal. Bladder function is normal. Patient is not sexually active. Contraception method is considering the PO pill. Postpartum depression screening:neg  The following portions of the patient's history were reviewed and updated as appropriate: allergies, current medications, past family history, past medical history, past social history, past surgical history and problem list. Last pap smear done 2019 and was Normal  Review of Systems Pertinent items noted in HPI and remainder of comprehensive ROS otherwise negative.    Objective:  unknown if currently breastfeeding.  General:  alert, cooperative and no distress   Breasts:  negative  Lungs: clear to auscultation bilaterally and normal percussion bilaterally  Heart:  regular rate and rhythm  Abdomen: soft, non-tender; bowel sounds normal; no masses,  no organomegaly   Vulva:  normal  Vagina: normal vagina  Cervix:  no cervical motion tenderness  Corpus: normal size, contour, position, consistency, mobility, non-tender  Adnexa:  normal adnexa  Rectal Exam: Not performed.        Assessment:    Normal postpartum exam. Pap smear not done at today's visit.   Plan:   1. Contraception: oral progesterone-only contraceptive 2. Depo Provera--considering.  Will send RX and she will bring with her.   3. Follow up in: 1 year or as needed.

## 2018-09-15 ENCOUNTER — Ambulatory Visit (INDEPENDENT_AMBULATORY_CARE_PROVIDER_SITE_OTHER): Payer: No Typology Code available for payment source

## 2018-09-15 ENCOUNTER — Other Ambulatory Visit: Payer: Self-pay

## 2018-09-15 DIAGNOSIS — Z30013 Encounter for initial prescription of injectable contraceptive: Secondary | ICD-10-CM

## 2018-09-15 DIAGNOSIS — Z3202 Encounter for pregnancy test, result negative: Secondary | ICD-10-CM

## 2018-09-15 LAB — POCT URINE PREGNANCY: Preg Test, Ur: NEGATIVE

## 2018-09-15 MED ORDER — MEDROXYPROGESTERONE ACETATE 150 MG/ML IM SUSP
150.0000 mg | Freq: Once | INTRAMUSCULAR | Status: AC
  Administered 2018-09-15: 150 mg via INTRAMUSCULAR

## 2018-09-15 NOTE — Progress Notes (Signed)
PT here for Depo Provera injection. Pregnancy test was negative. Pt is still taking Micronor. Injection given in right deltoid. Pt will return in 12 weeks.

## 2018-09-16 ENCOUNTER — Telehealth: Payer: Self-pay | Admitting: *Deleted

## 2018-09-16 NOTE — Telephone Encounter (Signed)
Left patient a message to call the office or to come by and do blood work today or tomorrow.

## 2018-10-03 ENCOUNTER — Ambulatory Visit (INDEPENDENT_AMBULATORY_CARE_PROVIDER_SITE_OTHER): Payer: No Typology Code available for payment source

## 2018-10-03 ENCOUNTER — Other Ambulatory Visit: Payer: Self-pay

## 2018-10-03 DIAGNOSIS — N898 Other specified noninflammatory disorders of vagina: Secondary | ICD-10-CM

## 2018-10-03 NOTE — Progress Notes (Signed)
Pt here for self swab. Pt c/o yellow vaginal discharge with an odor for four days. Self swab sent and pt is aware we will call with results and treat if necessary.

## 2018-10-04 LAB — TIQ-NTM

## 2018-10-10 LAB — BACTERIAL VAGINOSIS DNA
Atopobium vaginae: 6.6 Log (cells/mL)
Gardnerella vaginalis: >8 Log (cells/mL)
LACTOBACILLUS SPECIES: NOT DETECTED Log cells/mL
MEGASPHAERA SPECIES: NOT DETECTED Log cells/mL

## 2018-10-10 LAB — TEST AUTHORIZATION

## 2018-10-10 LAB — WET PREP BY MOLECULAR PROBE

## 2018-10-14 ENCOUNTER — Telehealth: Payer: Self-pay

## 2018-10-14 DIAGNOSIS — B9689 Other specified bacterial agents as the cause of diseases classified elsewhere: Secondary | ICD-10-CM

## 2018-10-14 DIAGNOSIS — N760 Acute vaginitis: Secondary | ICD-10-CM

## 2018-10-14 MED ORDER — METRONIDAZOLE 0.75 % VA GEL: 1.0000 | Freq: Every day | VAGINAL | 0 refills | Status: DC

## 2018-10-14 NOTE — Telephone Encounter (Signed)
Called pt to inform her of positive BV results. Metrogel was sent to pharmacy.  Toni Ball, CMA

## 2018-11-26 ENCOUNTER — Other Ambulatory Visit: Payer: Self-pay | Admitting: *Deleted

## 2018-11-26 MED ORDER — MEDROXYPROGESTERONE ACETATE 150 MG/ML IM SUSP: 150.0000 mg | INTRAMUSCULAR | 2 refills | Status: DC

## 2018-12-04 ENCOUNTER — Other Ambulatory Visit: Payer: Self-pay

## 2018-12-04 ENCOUNTER — Ambulatory Visit (INDEPENDENT_AMBULATORY_CARE_PROVIDER_SITE_OTHER): Payer: No Typology Code available for payment source

## 2018-12-04 DIAGNOSIS — Z3042 Encounter for surveillance of injectable contraceptive: Secondary | ICD-10-CM

## 2018-12-04 MED ORDER — MEDROXYPROGESTERONE ACETATE 150 MG/ML IM SUSP
150.0000 mg | Freq: Once | INTRAMUSCULAR | Status: AC
  Administered 2018-12-04: 16:00:00 150 mg via INTRAMUSCULAR

## 2018-12-04 NOTE — Progress Notes (Signed)
Pt here for Depo Provera injection. Injection given in right deltoid and tolerated well. PT has no complaints today and will return in 12 weeks for next injection.

## 2019-02-19 ENCOUNTER — Ambulatory Visit (INDEPENDENT_AMBULATORY_CARE_PROVIDER_SITE_OTHER): Payer: No Typology Code available for payment source | Admitting: *Deleted

## 2019-02-19 ENCOUNTER — Other Ambulatory Visit: Payer: Self-pay

## 2019-02-19 DIAGNOSIS — Z3042 Encounter for surveillance of injectable contraceptive: Secondary | ICD-10-CM

## 2019-02-19 MED ORDER — MEDROXYPROGESTERONE ACETATE 150 MG/ML IM SUSP
150.0000 mg | INTRAMUSCULAR | Status: DC
  Administered 2019-02-19: 150 mg via INTRAMUSCULAR

## 2019-02-19 NOTE — Progress Notes (Signed)
Pt here for Depo Provera injection.  Pt provided her own med.

## 2019-05-19 ENCOUNTER — Ambulatory Visit (INDEPENDENT_AMBULATORY_CARE_PROVIDER_SITE_OTHER): Payer: No Typology Code available for payment source

## 2019-05-19 ENCOUNTER — Other Ambulatory Visit: Payer: Self-pay

## 2019-05-19 DIAGNOSIS — Z3042 Encounter for surveillance of injectable contraceptive: Secondary | ICD-10-CM | POA: Diagnosis not present

## 2019-05-19 MED ORDER — MEDROXYPROGESTERONE ACETATE 150 MG/ML IM SUSP: 150.0000 mg | Freq: Once | INTRAMUSCULAR | Status: DC

## 2019-05-19 NOTE — Progress Notes (Signed)
Pt here for Depo Provera injection. Injection tolerated well. Pt will return in 12 weeks for next injection.

## 2019-08-04 ENCOUNTER — Ambulatory Visit (INDEPENDENT_AMBULATORY_CARE_PROVIDER_SITE_OTHER): Payer: No Typology Code available for payment source | Admitting: Family Medicine

## 2019-08-04 ENCOUNTER — Encounter: Payer: Self-pay | Admitting: Family Medicine

## 2019-08-04 ENCOUNTER — Other Ambulatory Visit: Payer: Self-pay

## 2019-08-04 VITALS — BP 115/78 | HR 122 | Temp 98.5°F | Resp 16 | Ht 62.75 in | Wt 195.0 lb

## 2019-08-04 DIAGNOSIS — Z124 Encounter for screening for malignant neoplasm of cervix: Secondary | ICD-10-CM

## 2019-08-04 DIAGNOSIS — N94819 Vulvodynia, unspecified: Secondary | ICD-10-CM | POA: Insufficient documentation

## 2019-08-04 DIAGNOSIS — Z01419 Encounter for gynecological examination (general) (routine) without abnormal findings: Secondary | ICD-10-CM

## 2019-08-04 DIAGNOSIS — Z30015 Encounter for initial prescription of vaginal ring hormonal contraceptive: Secondary | ICD-10-CM

## 2019-08-04 MED ORDER — LIDOCAINE VISCOUS HCL 2 % MT SOLN: 15.0000 mL | OROMUCOSAL | 2 refills | Status: AC | PRN

## 2019-08-04 MED ORDER — ETONOGESTREL-ETHINYL ESTRADIOL 0.12-0.015 MG/24HR VA RING: VAGINAL_RING | VAGINAL | 3 refills | Status: AC

## 2019-08-04 NOTE — Patient Instructions (Signed)
 Preventive Care 21-27 Years Old, Female Preventive care refers to visits with your health care provider and lifestyle choices that can promote health and wellness. This includes:  A yearly physical exam. This may also be called an annual well check.  Regular dental visits and eye exams.  Immunizations.  Screening for certain conditions.  Healthy lifestyle choices, such as eating a healthy diet, getting regular exercise, not using drugs or products that contain nicotine and tobacco, and limiting alcohol use. What can I expect for my preventive care visit? Physical exam Your health care provider will check your:  Height and weight. This may be used to calculate body mass index (BMI), which tells if you are at a healthy weight.  Heart rate and blood pressure.  Skin for abnormal spots. Counseling Your health care provider may ask you questions about your:  Alcohol, tobacco, and drug use.  Emotional well-being.  Home and relationship well-being.  Sexual activity.  Eating habits.  Work and work environment.  Method of birth control.  Menstrual cycle.  Pregnancy history. What immunizations do I need?  Influenza (flu) vaccine  This is recommended every year. Tetanus, diphtheria, and pertussis (Tdap) vaccine  You may need a Td booster every 10 years. Varicella (chickenpox) vaccine  You may need this if you have not been vaccinated. Human papillomavirus (HPV) vaccine  If recommended by your health care provider, you may need three doses over 6 months. Measles, mumps, and rubella (MMR) vaccine  You may need at least one dose of MMR. You may also need a second dose. Meningococcal conjugate (MenACWY) vaccine  One dose is recommended if you are age 19-21 years and a first-year college student living in a residence hall, or if you have one of several medical conditions. You may also need additional booster doses. Pneumococcal conjugate (PCV13) vaccine  You may need  this if you have certain conditions and were not previously vaccinated. Pneumococcal polysaccharide (PPSV23) vaccine  You may need one or two doses if you smoke cigarettes or if you have certain conditions. Hepatitis A vaccine  You may need this if you have certain conditions or if you travel or work in places where you may be exposed to hepatitis A. Hepatitis B vaccine  You may need this if you have certain conditions or if you travel or work in places where you may be exposed to hepatitis B. Haemophilus influenzae type b (Hib) vaccine  You may need this if you have certain conditions. You may receive vaccines as individual doses or as more than one vaccine together in one shot (combination vaccines). Talk with your health care provider about the risks and benefits of combination vaccines. What tests do I need?  Blood tests  Lipid and cholesterol levels. These may be checked every 5 years starting at age 20.  Hepatitis C test.  Hepatitis B test. Screening  Diabetes screening. This is done by checking your blood sugar (glucose) after you have not eaten for a while (fasting).  Sexually transmitted disease (STD) testing.  BRCA-related cancer screening. This may be done if you have a family history of breast, ovarian, tubal, or peritoneal cancers.  Pelvic exam and Pap test. This may be done every 3 years starting at age 21. Starting at age 30, this may be done every 5 years if you have a Pap test in combination with an HPV test. Talk with your health care provider about your test results, treatment options, and if necessary, the need for more   tests. Follow these instructions at home: Eating and drinking   Eat a diet that includes fresh fruits and vegetables, whole grains, lean protein, and low-fat dairy.  Take vitamin and mineral supplements as recommended by your health care provider.  Do not drink alcohol if: ? Your health care provider tells you not to drink. ? You are  pregnant, may be pregnant, or are planning to become pregnant.  If you drink alcohol: ? Limit how much you have to 0-1 drink a day. ? Be aware of how much alcohol is in your drink. In the [place] one drink equals one 12 oz bottle of beer (355 mL), one 5 oz glass of wine (148 mL), or one 1 oz glass of hard liquor (44 mL). Lifestyle  Take daily care of your teeth and gums.  Stay active. Exercise for at least 30 minutes on 5 or more days each week.  Do not use any products that contain nicotine or tobacco, such as cigarettes, e-cigarettes, and chewing tobacco. If you need help quitting, ask your health care provider.  If you are sexually active, practice safe sex. Use a condom or other form of birth control (contraception) in order to prevent pregnancy and STIs (sexually transmitted infections). If you plan to become pregnant, see your health care provider for a preconception visit. What's next?  Visit your health care provider once a year for a well check visit.  Ask your health care provider how often you should have your eyes and teeth checked.  Stay up to date on all vaccines. This information is not intended to replace advice given to you by your health care provider. Make sure you discuss any questions you have with your health care provider. Document Revised: 01/16/2018 Document Reviewed: 01/16/2018 Elsevier Patient Education  2020 Elsevier Inc.  

## 2019-08-04 NOTE — Assessment & Plan Note (Signed)
Trial of light massage with viscous lidocaine--may refer to pelvic PT if no relief with this.

## 2019-08-04 NOTE — Progress Notes (Signed)
  Subjective:     Toni Ball is a 29 y.o. female and is here for a comprehensive physical exam. The patient reports problems - tender in vagina since delivery. Painful intercourse..  Wants to change birth control, has gained a lot of weight on the Depo. Having some incontinence when she sneezes.  The following portions of the patient's history were reviewed and updated as appropriate: allergies, current medications, past family history, past medical history, past social history, past surgical history and problem list.  Review of Systems Pertinent items noted in HPI and remainder of comprehensive ROS otherwise negative.   Objective:    BP 115/78   Pulse (!) 122   Temp 98.5 F (36.9 C)   Resp 16   Ht 5' 2.75" (1.594 m)   Wt 195 lb (88.5 kg)   LMP 08/03/2019   Breastfeeding No   BMI 34.82 kg/m  General appearance: alert, cooperative and appears stated age Head: Normocephalic, without obvious abnormality, atraumatic Neck: no JVD, supple, symmetrical, trachea midline and thyroid not enlarged, symmetric, no tenderness/mass/nodules Lungs: clear to auscultation bilaterally Breasts: normal appearance, no masses or tenderness Heart: regular rate and rhythm, S1, S2 normal, no murmur, click, rub or gallop Abdomen: soft, non-tender; bowel sounds normal; no masses,  no organomegaly Pelvic: cervix normal in appearance, external genitalia normal, no adnexal masses or tenderness, no cervical motion tenderness, uterus normal size, shape, and consistency and vaginal tenderness right sided, posteriorly to palpation, no defet noted Extremities: Homans sign is negative, no sign of DVT Pulses: 2+ and symmetric Skin: Skin color, texture, turgor normal. No rashes or lesions Lymph nodes: Cervical, supraclavicular, and axillary nodes normal. Neurologic: Grossly normal    Assessment:    Healthy female exam.      Plan:   Problem List Items Addressed This Visit      Unprioritized   Vulvodynia    Trial of light massage with viscous lidocaine--may refer to pelvic PT if no relief with this.      Relevant Medications   lidocaine (XYLOCAINE) 2 % solution    Other Visit Diagnoses    Encounter for gynecological examination without abnormal finding    -  Primary   Relevant Orders   CBC   Comprehensive metabolic panel   Hemoglobin A1c   TSH   Lipid panel   VITAMIN D 25 Hydroxy (Vit-D Deficiency, Fractures)   Cytology - PAP( [facility])   Encounter for initial prescription of vaginal ring hormonal contraceptive       Relevant Medications   etonogestrel-ethinyl estradiol (NUVARING) 0.12-0.015 MG/24HR vaginal ring     Return in 1 year (on 08/03/2020).    See After Visit Summary for Counseling Recommendations

## 2019-08-05 LAB — COMPREHENSIVE METABOLIC PANEL
AG Ratio: 1.5 (calc) (ref 1.0–2.5)
ALT: 16 U/L (ref 6–29)
AST: 13 U/L (ref 10–30)
Albumin: 4.4 g/dL (ref 3.6–5.1)
Alkaline phosphatase (APISO): 63 U/L (ref 31–125)
BUN: 14 mg/dL (ref 7–25)
CO2: 26 mmol/L (ref 20–32)
Calcium: 9.8 mg/dL (ref 8.6–10.2)
Chloride: 103 mmol/L (ref 98–110)
Creat: 0.63 mg/dL (ref 0.50–1.10)
Globulin: 3 g/dL (calc) (ref 1.9–3.7)
Glucose, Bld: 113 mg/dL — ABNORMAL HIGH (ref 65–99)
Potassium: 3.5 mmol/L (ref 3.5–5.3)
Sodium: 139 mmol/L (ref 135–146)
Total Bilirubin: 0.3 mg/dL (ref 0.2–1.2)
Total Protein: 7.4 g/dL (ref 6.1–8.1)

## 2019-08-05 LAB — CBC
HCT: 39.8 % (ref 35.0–45.0)
Hemoglobin: 12.8 g/dL (ref 11.7–15.5)
MCH: 27.5 pg (ref 27.0–33.0)
MCHC: 32.2 g/dL (ref 32.0–36.0)
MCV: 85.6 fL (ref 80.0–100.0)
MPV: 11.5 fL (ref 7.5–12.5)
Platelets: 420 10*3/uL — ABNORMAL HIGH (ref 140–400)
RBC: 4.65 10*6/uL (ref 3.80–5.10)
RDW: 12.7 % (ref 11.0–15.0)
WBC: 12.8 10*3/uL — ABNORMAL HIGH (ref 3.8–10.8)

## 2019-08-05 LAB — LIPID PANEL
Cholesterol: 142 mg/dL (ref <200)
HDL: 40 mg/dL — ABNORMAL LOW (ref >=50)
LDL Cholesterol (Calc): 75 mg/dL (calc)
Non-HDL Cholesterol (Calc): 102 mg/dL (calc) (ref <130)
Total CHOL/HDL Ratio: 3.6 (calc) (ref <5.0)
Triglycerides: 174 mg/dL — ABNORMAL HIGH (ref <150)

## 2019-08-05 LAB — HEMOGLOBIN A1C
Hgb A1c MFr Bld: 5.5 % of total Hgb (ref <5.7)
Mean Plasma Glucose: 111 (calc)
eAG (mmol/L): 6.2 (calc)

## 2019-08-05 LAB — CYTOLOGY - PAP: Diagnosis: NEGATIVE

## 2019-08-05 LAB — TSH: TSH: 1.41 mIU/L

## 2019-08-05 LAB — VITAMIN D 25 HYDROXY (VIT D DEFICIENCY, FRACTURES): Vit D, 25-Hydroxy: 21 ng/mL — ABNORMAL LOW (ref 30–100)

## 2019-08-18 IMAGING — US US MFM OB COMP +14 WKS
1 series · 14 of 28 positions shown · non-contrast
Comparison: none

[Series 1: us mfm ob comp +14 wks · 67 acquisitions, 14 frames shown]
[im 3/67]
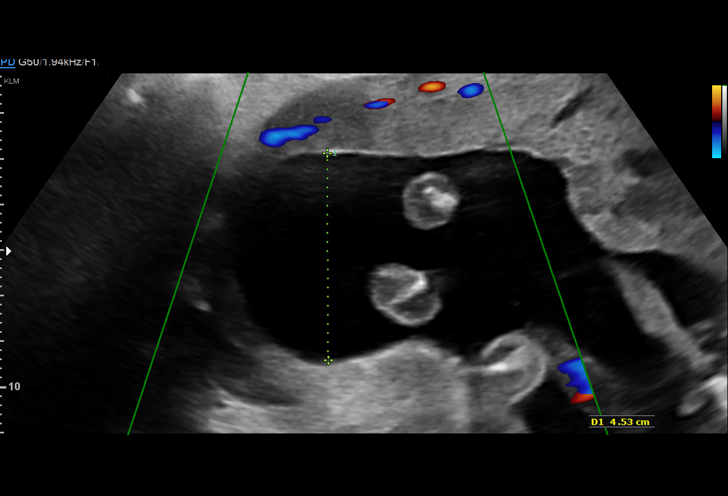
[im 8/67]
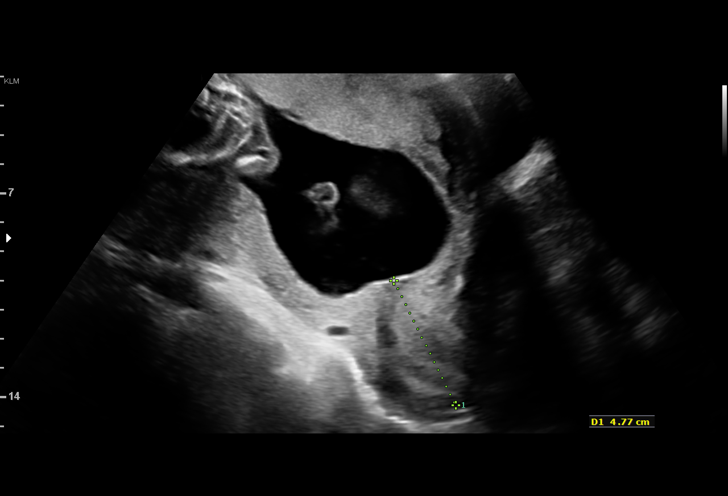
[im 13/67]
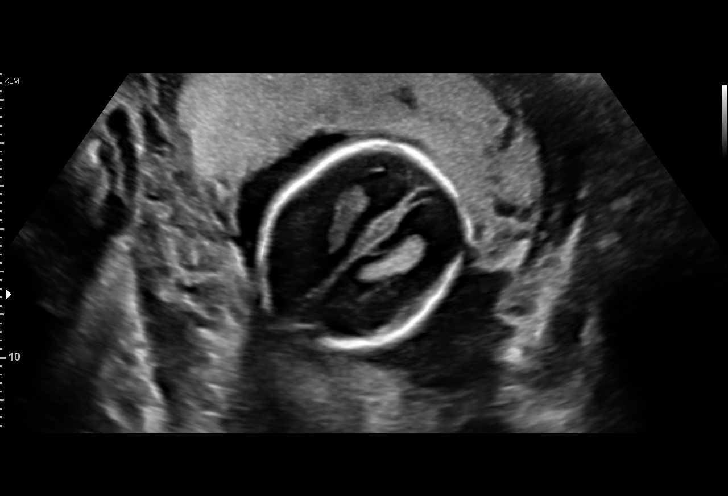
[im 18/67]
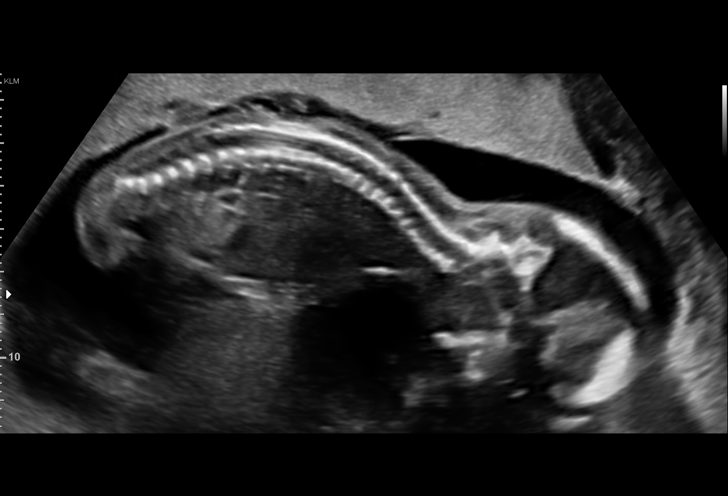
[im 23/67]
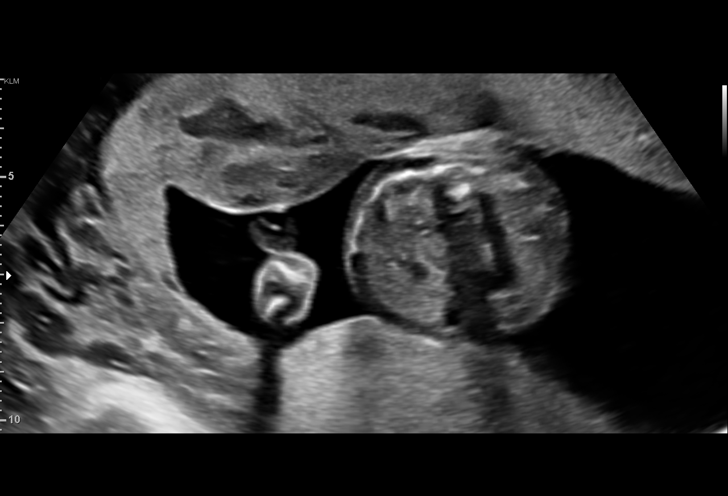
[im 27/67]
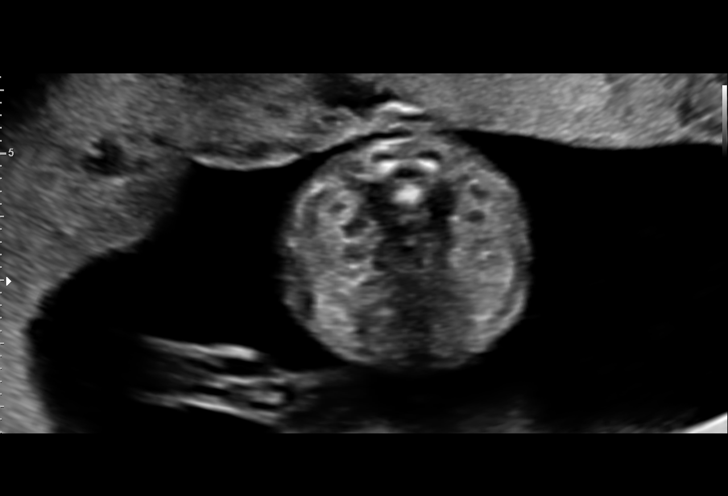
[im 32/67]
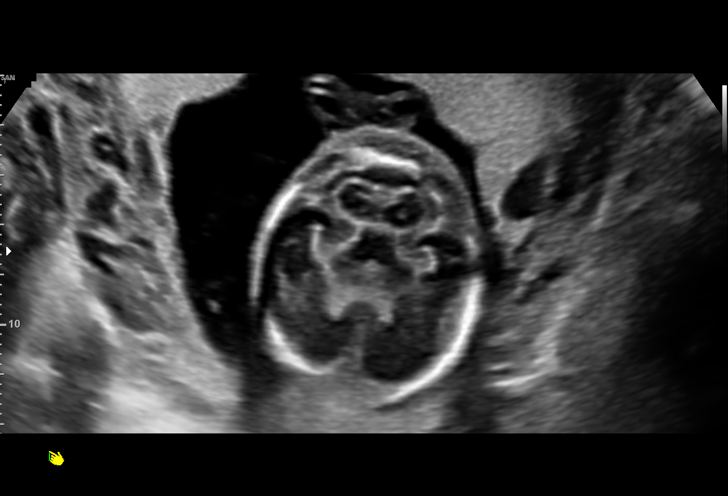
[im 37/67]
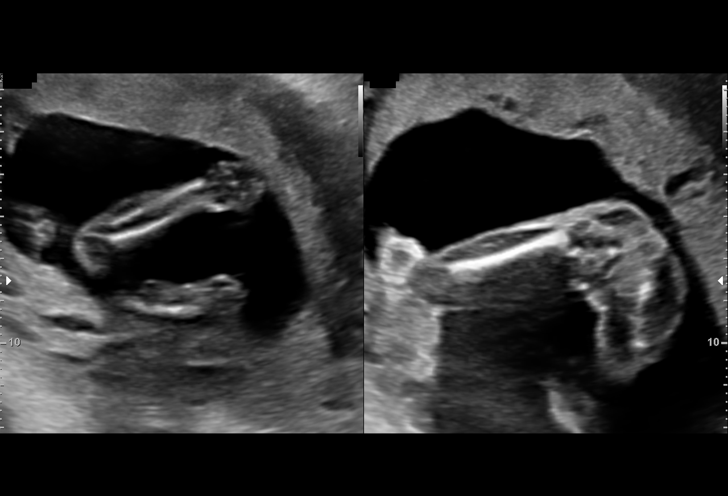
[im 42/67]
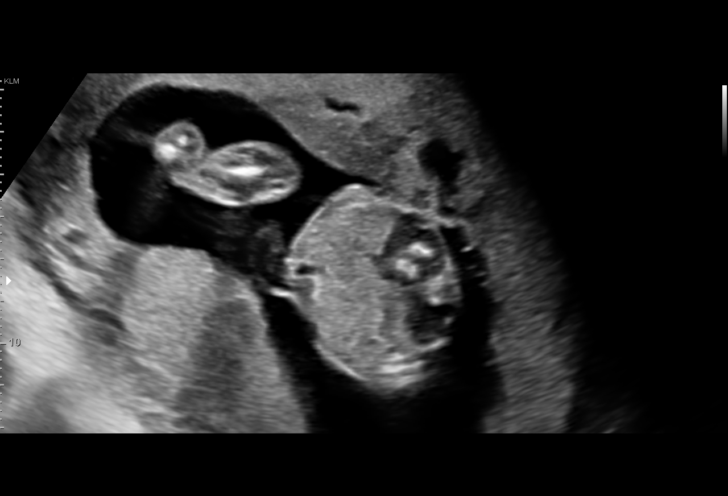
[im 47/67]
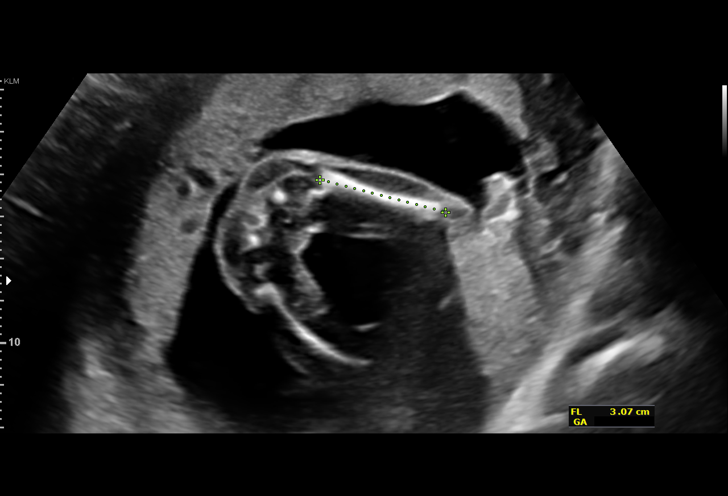
[im 52/67]
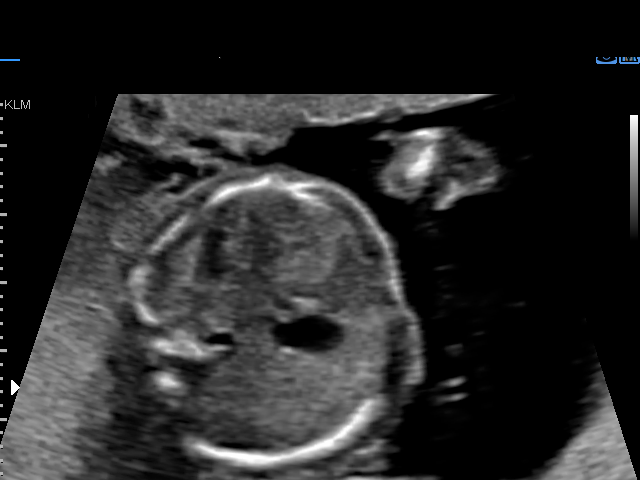
[im 57/67]
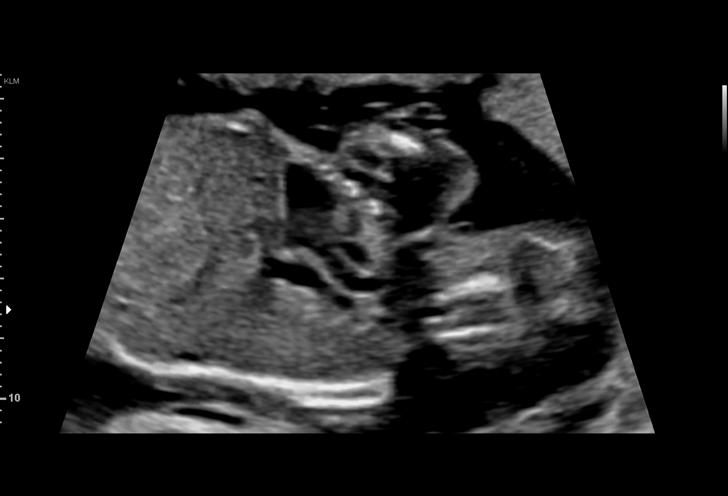
[im 62/67]
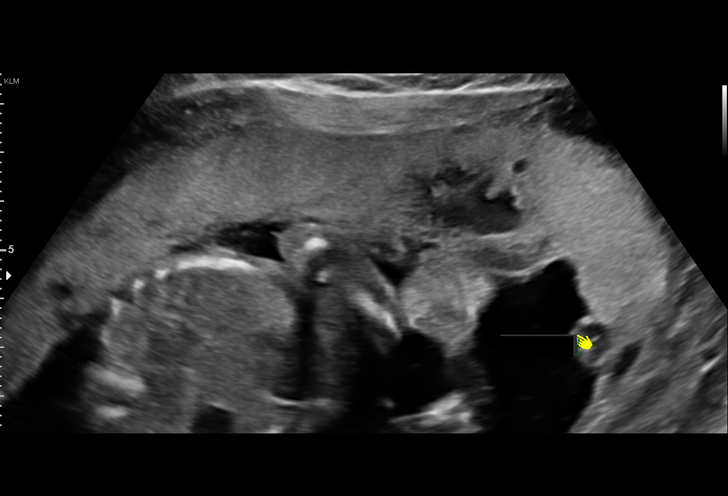
[im 67/67]
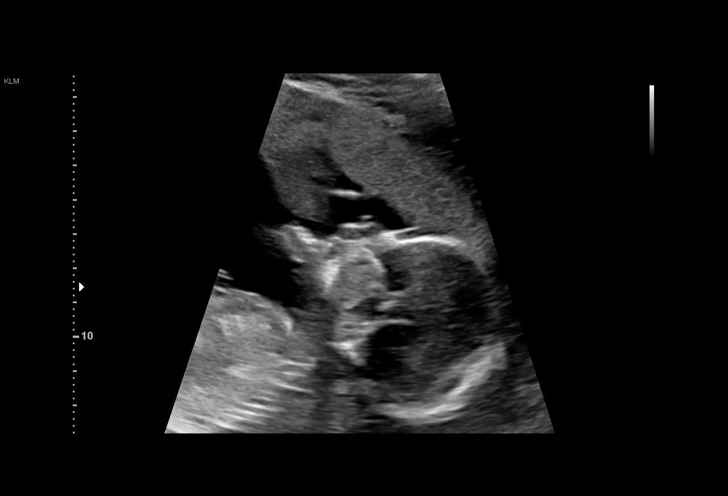

[14 of 28 positions shown; findings below may reference images not displayed]

1  US MFM OB COMP + 14 WK               76805.01     ANAYELY MIRIAM

Indications

19 weeks gestation of pregnancy
Encounter for antenatal screening for
malformations
Fetal Evaluation

Num Of Fetuses:         1
Fetal Heart Rate(bpm):  149
Cardiac Activity:       Observed
Presentation:           Cephalic
Placenta:               Anterior
P. Cord Insertion:      Visualized

Amniotic Fluid
AFI FV:      Within normal limits

Largest Pocket(cm)
4.5
Biometry

BPD:      45.2  mm     G. Age:  19w 5d         54  %    CI:         78.9   %    70 - 86
FL/HC:      18.6   %    16.8 -
HC:      160.9  mm     G. Age:  18w 6d         16  %    HC/AC:      1.13        1.09 -
AC:      142.8  mm     G. Age:  19w 4d         47  %    FL/BPD:     66.4   %
FL:         30  mm     G. Age:  19w 2d         32  %    FL/AC:      21.0   %    20 - 24
HUM:      28.2  mm     G. Age:  19w 1d         39  %
CER:        21  mm     G. Age:  20w 0d         58  %

Est. FW:     291  gm    0 lb 10 oz      44  %
OB History
Gravidity:    1         Term:   0        Prem:   0        SAB:   0
TOP:          0       Ectopic:  0        Living: 0
Gestational Age

LMP:           19w 4d        Date:  08/25/17                 EDD:   06/01/18
U/S Today:     19w 3d                                        EDD:   06/02/18
Best:          19w 4d     Det. By:  LMP  (08/25/17)          EDD:   06/01/18
Anatomy

Cranium:               Appears normal         Aortic Arch:            Appears normal
Cavum:                 Not well visualized    Ductal Arch:            Not well visualized
Ventricles:            Appears normal         Diaphragm:              Appears normal
Choroid Plexus:        Appears normal         Stomach:                Appears normal, left
sided
Cerebellum:            Appears normal         Abdomen:                Appears normal
Posterior Fossa:       Appears normal         Abdominal Wall:         Appears nml (cord
insert, abd wall)
Nuchal Fold:           Appears normal         Cord Vessels:           Appears normal (3
vessel cord)
Face:                  Orbits nl; profile not Kidneys:                Appear normal
well visualized
Lips:                  Not well visualized    Bladder:                Appears normal
Thoracic:              Appears normal         Spine:                  Appears normal
Heart:                 Appears normal         Upper Extremities:      Appears normal
(4CH, axis, and situs
RVOT:                  Appears normal         Lower Extremities:      Appears normal
LVOT:                  Appears normal

Other:  Heels visualized. Technically difficult due to fetal position.
Cervix Uterus Adnexa

Cervix
Length:            4.8  cm.
Normal appearance by transabdominal scan.

Uterus
No abnormality visualized.

Left Ovary
Within normal limits.

Right Ovary
Within normal limits.

Cul De Sac
No free fluid seen.

Adnexa
No abnormality visualized.
Impression

We performed fetal anatomy scan. No makers of
aneuploidies or fetal structural defects are seen. Fetal
biometry is consistent with her previously-established dates.
Amniotic fluid is normal and good fetal activity is seen.

On cell-free fetal DNA screening, the risks of fetal
aneuploidies are not increased.
Recommendations

Follow-up scans as clinically indicated.

## 2019-08-27 ENCOUNTER — Telehealth: Payer: Self-pay

## 2019-08-27 ENCOUNTER — Telehealth: Payer: Self-pay | Admitting: Medical

## 2019-08-27 NOTE — Telephone Encounter (Signed)
Patient states she is having SOB due to COVID and wants to know if there is anything she can take for the SOB and coughing.

## 2019-08-28 ENCOUNTER — Emergency Department (INDEPENDENT_AMBULATORY_CARE_PROVIDER_SITE_OTHER): Payer: No Typology Code available for payment source

## 2019-08-28 ENCOUNTER — Telehealth (INDEPENDENT_AMBULATORY_CARE_PROVIDER_SITE_OTHER): Payer: No Typology Code available for payment source | Admitting: Medical

## 2019-08-28 ENCOUNTER — Other Ambulatory Visit: Payer: Self-pay

## 2019-08-28 ENCOUNTER — Telehealth: Payer: Self-pay | Admitting: Medical

## 2019-08-28 ENCOUNTER — Emergency Department
Admission: EM | Admit: 2019-08-28 | Discharge: 2019-08-28 | Disposition: A | Discharge status: Home / Self Care | Payer: No Typology Code available for payment source | Source: Home / Self Care | Attending: Family Medicine | Admitting: Family Medicine

## 2019-08-28 VITALS — Temp 101.0°F

## 2019-08-28 DIAGNOSIS — R509 Fever, unspecified: Secondary | ICD-10-CM

## 2019-08-28 DIAGNOSIS — R110 Nausea: Secondary | ICD-10-CM

## 2019-08-28 DIAGNOSIS — R0600 Dyspnea, unspecified: Secondary | ICD-10-CM | POA: Diagnosis not present

## 2019-08-28 DIAGNOSIS — U071 COVID-19: Secondary | ICD-10-CM

## 2019-08-28 LAB — POCT CBC W AUTO DIFF (K'VILLE URGENT CARE)

## 2019-08-28 MED ORDER — BENZONATATE 200 MG PO CAPS: ORAL_CAPSULE | ORAL | 0 refills | Status: DC

## 2019-08-28 MED ORDER — PREDNISONE 20 MG PO TABS: ORAL_TABLET | ORAL | 0 refills | Status: AC

## 2019-08-28 MED ORDER — AZITHROMYCIN 250 MG PO TABS: ORAL_TABLET | ORAL | 0 refills | Status: AC

## 2019-08-28 MED ORDER — ALBUTEROL SULFATE HFA 108 (90 BASE) MCG/ACT IN AERS: 2.0000 | INHALATION_SPRAY | RESPIRATORY_TRACT | 0 refills | Status: AC | PRN

## 2019-08-28 MED ORDER — BENZONATATE 200 MG PO CAPS: ORAL_CAPSULE | ORAL | 0 refills | Status: AC

## 2019-08-28 MED ORDER — ONDANSETRON 4 MG PO TBDP
4.0000 mg | ORAL_TABLET | Freq: Once | ORAL | Status: AC
  Administered 2019-08-28: 4 mg via ORAL

## 2019-08-28 MED ORDER — METHYLPREDNISOLONE SODIUM SUCC 125 MG IJ SOLR
80.0000 mg | Freq: Once | INTRAMUSCULAR | Status: AC
  Administered 2019-08-28: 80 mg via INTRAMUSCULAR

## 2019-08-28 MED ORDER — ONDANSETRON 4 MG PO TBDP: ORAL_TABLET | ORAL | 0 refills | Status: AC

## 2019-08-28 NOTE — Discharge Instructions (Addendum)
Begin prednisone Saturday 08/29/19. Take plain guaifenesin (1200mg  extended release tabs such as Mucinex) twice daily, with plenty of water, for cough and congestion.  Get adequate rest.   May take Delsym Cough Suppressant at bedtime for nighttime cough with Tessalon if needed. Stop all antihistamines for now, and other non-prescription cough/cold preparations.   Followup with family doctor for management of your Vitamin D insufficiency.  If your COVID19 test is positive, then you are infected with the novel coronavirus and could give the virus to others.  Please continue isolation at home for at least 10 days since the start of your symptoms.  Once you complete your 10 day quarantine, you may return to normal activities as long as you've not had a fever for over 24 hours (without taking fever reducing medicine) and your symptoms are improving. Please continue good preventive care measures, including:  frequent hand-washing, avoid touching your face, cover coughs/sneezes, stay out of crowds and keep a 6 foot distance from others.  Go to the nearest hospital emergency room if fever/cough/breathlessness are severe or illness seems like a threat to life.

## 2019-08-28 NOTE — Progress Notes (Signed)
   Subjective:    Patient ID: Toni Ball, female    DOB: 1990/11/20, 29 y.o.   MRN: [id number]  HPI  Virtual Visit via Telephone Note  I connected with Wai Minotti on 08/28/19 at 11:00 AM EDT by telephone and verified that I am speaking with the correct person using two identifiers.  Location: Patient: home Provider: office  Attempted caregility but failed.   I discussed the limitations, risks, security and privacy concerns of performing an evaluation and management service by telephone and the availability of in person appointments. I also discussed with the patient that there may be a patient responsible charge related to this service. The patient expressed understanding and agreed to proceed.   History of Present Illness: Pt states last Thursday tickle in throat. +covid test on Monday. Monday ha, sinus congestion, nausea and st. Pt now has shortness of breath even walking short distances. Has to use pillows to sit up. Chest tightness. Last 2 days has felt short of breath.   Pt does have fever.  LMP- should be on now. Has nuvaring. Has not taken it out.  Takes 5 feet and will cough and short of breath.   No leg pain. Early on some cramps.   Observations/Objective: General- no acute, distress, alert, oriented and normal speech.  Assessment and Plan: Recent + with covid test and significant fever with sob. Concern for covid pneumonia as we approach this weekend. Virtual visit not adequate. Recommend be seen at [facility] for cxr, labs and 02 sat to determined proper level of tx.  Will follow work up sent to me in epic.  Pt expressed understand.  Follow up date pending UC evaluation.   Follow Up Instructions:    I discussed the assessment and treatment plan with the patient. The patient was provided an opportunity to ask questions and all were answered. The patient agreed with the plan and demonstrated an understanding of the instructions.   The patient was advised to call  back or seek an in-person evaluation if the symptoms worsen or if the condition fails to improve as anticipated.  I provided 15 minutes of non-face-to-face time during this encounter.   Esperanza Richters, PA-C   Review of Systems     Objective:   Physical Exam        Assessment & Plan:

## 2019-08-28 NOTE — ED Provider Notes (Signed)
[facility]    CSN: [id number] Arrival date & time: 08/28/19  1246      History   Chief Complaint Chief Complaint  Patient presents with  . COVID 19    HPI Toni Ball is a 29 y.o. female.   Patient complains of onset of URI symptoms with sore throat and sinus congestion five days ago, initially mild but rapidly becoming worse.  She then developed malaise, fever/chills, myalgias, headache, nausea/vomiting, and non-productive cough.  She has developed increasing dyspnea and tightness in her anterior chest without pleuritic pain.  She also lost her sense of taste/smell four days ago.  Her vomiting has resolved but she continues to have nausea and loose stools.  A COVID19 test that was done four days ago has returned positive.  The history is provided by the patient.    Past Medical History:  Diagnosis Date  . Allergy   . Migraine     Patient Active Problem List   Diagnosis Date Noted  . Vulvodynia 08/04/2019    No past surgical history on file.  OB History    Gravida  1   Para  1   Term  1   Preterm      AB      Living  1     SAB      TAB      Ectopic      Multiple  0   Live Births  1            Home Medications    Prior to Admission medications   Medication Sig Start Date End Date Taking? Authorizing Provider  albuterol (VENTOLIN HFA) 108 (90 Base) MCG/ACT inhaler Inhale 2 puffs into the lungs every 4 (four) hours as needed for wheezing or shortness of breath. 08/28/19   Lattie Haw, MD  azithromycin (ZITHROMAX Z-PAK) 250 MG tablet Take 2 tabs today; then begin one tab once daily for 4 more days. 08/28/19   Lattie Haw, MD  benzonatate (TESSALON) 200 MG capsule Take one cap by mouth at bedtime as needed for cough.  May repeat in 4 to 6 hours 08/28/19   Lattie Haw, MD  docusate sodium (COLACE) 100 MG capsule Take 100 mg by mouth 2 (two) times daily.    [provider]  EPINEPHrine 0.3 mg/0.3 mL IJ SOAJ injection  Inject into the muscle. 02/14/17   [provider]  etonogestrel-ethinyl estradiol (NUVARING) 0.12-0.015 MG/24HR vaginal ring Insert vaginally and leave in place for 3 consecutive weeks, then remove for 1 week. 08/04/19   Reva Bores, MD  ibuprofen (ADVIL,MOTRIN) 600 MG tablet Take 1 tablet (600 mg total) by mouth every 6 (six) hours as needed. Patient not taking: Reported on 08/28/2019 06/06/18   Arabella Merles, CNM  lidocaine (XYLOCAINE) 2 % solution Use as directed 15 mLs in the mouth or throat as needed for mouth pain. Patient not taking: Reported on 08/28/2019 08/04/19   Reva Bores, MD  loratadine (CLARITIN) 10 MG tablet Take 10 mg by mouth daily.    [provider]  ondansetron (ZOFRAN ODT) 4 MG disintegrating tablet Take one tab by mouth Q6hr prn nausea.  Dissolve under tongue. 08/28/19   Lattie Haw, MD  predniSONE (DELTASONE) 20 MG tablet Take one tab by mouth twice daily for 4 days, then one daily for 3 days. Take with food. 08/28/19   Lattie Haw, MD    Family History Family History  Problem Relation Age of Onset  . Cancer Mother        cervical  . Hypertension Mother   . Asthma Mother   . Hyperlipidemia Father   . Hypertension Father   . Asthma Father   . Hypertension Maternal Grandmother   . Hearing loss Maternal Grandmother   . Hypertension Maternal Grandfather   . Diabetes Paternal Grandmother   . Hypertension Paternal Grandmother   . Hypertension Paternal Grandfather   . Stroke Neg Hx     Social History Social History   Tobacco Use  . Smoking status: Never Smoker  . Smokeless tobacco: Never Used  Substance Use Topics  . Alcohol use: Never  . Drug use: Never     Allergies   Peanut-containing drug products and Cinnamon   Review of Systems Review of Systems + sore throat + cough No pleuritic pain No wheezing + nasal congestion ? post-nasal drainage No sinus pain/pressure No itchy/red eyes No earache No hemoptysis + SOB +  fever, + chills + nausea + vomiting No abdominal pain + diarrhea No urinary symptoms No skin rash + fatigue + myalgias + headache Used OTC meds (Robitussin DM) without relief   Physical Exam Triage Vital Signs ED Triage Vitals  Enc Vitals Group     BP 08/28/19 1302 104/67     Pulse Rate 08/28/19 1302 (!) 108     Resp --      Temp 08/28/19 1302 99.6 F (37.6 C)     Temp Source 08/28/19 1302 Oral     SpO2 08/28/19 1302 96 %     Weight 08/28/19 1303 190 lb (86.2 kg)     Height 08/28/19 1303 5\' 3"  (1.6 m)     Head Circumference --      Peak Flow --      Pain Score 08/28/19 1303 3     Pain Loc --      Pain Edu? --      Excl. in GC? --    No data found.  Updated Vital Signs BP 104/67 (BP Location: Right Arm)   Pulse (!) 108   Temp 99.6 F (37.6 C) (Oral)   Ht 5\' 3"  (1.6 m)   Wt 86.2 kg   LMP 08/03/2019 Comment: nuvaring  SpO2 96%   BMI 33.66 kg/m   Visual Acuity Right Eye Distance:   Left Eye Distance:   Bilateral Distance:    Right Eye Near:   Left Eye Near:    Bilateral Near:     Physical Exam Nursing notes and Vital Signs reviewed. Appearance:  Patient appears stated age, and in no acute distress Eyes:  Pupils are equal, round, and reactive to light and accomodation.  Extraocular movement is intact.  Conjunctivae are not inflamed  Ears:  Canals normal.  Tympanic membranes normal.  Nose:  Normal turbinates.  No sinus tenderness.    Pharynx:  Normal Neck:  Supple.  Tender shotty lateral nodes.  Lungs:  Clear to auscultation.  Breath sounds are equal.  Moving air well. Heart:  Regular rate and rhythm without murmurs, rubs, or gallops.  Abdomen:  Nontender without masses or hepatosplenomegaly.  Bowel sounds are present.  No CVA or flank tenderness.  Extremities:  No edema.  Skin:  No rash present.   UC Treatments / Results  Labs (all labs ordered are listed, but only abnormal results are displayed) Labs Reviewed  POCT CBC W AUTO DIFF ([facility]):  WBC 6.4; LY 40.8; MO 6.8;  GR 52.4; Hgb 12.2; Platelets 299     EKG   Radiology DG Chest 2 View  Result Date: 08/28/2019 CLINICAL DATA:  Patient diagnosed is COVID-19 positive 08/24/2019. Cough and shortness of breath for over 1 week. EXAM: CHEST - 2 VIEW COMPARISON:  None. FINDINGS: There is subtle, patchy bilateral airspace disease which is most conspicuous on the PA view in the right mid and lower lung. No pneumothorax or pleural effusion. Heart size is normal. No bony abnormality. IMPRESSION: Subtle, patchy bilateral airspace disease worrisome for pneumonia. Electronically Signed   By: Inge Rise M.D.   On: 08/28/2019 14:07    Procedures Procedures (including critical care time)  Medications Ordered in UC Medications  ondansetron (ZOFRAN-ODT) disintegrating tablet 4 mg (4 mg Oral Given 08/28/19 1403)  methylPREDNISolone sodium succinate (SOLU-MEDROL) 125 mg/2 mL injection 80 mg (80 mg Intramuscular Given 08/28/19 1442)    Initial Impression / Assessment and Plan / UC Course  I have reviewed the triage vital signs and the nursing notes.  Pertinent labs & imaging results that were available during my care of the patient were reviewed by me and considered in my medical decision making (see chart for details).    Normal WBC (6.4) reassuring.  Begin empiric Z-pak. Administered Zofran ODT 4mg  PO; given Rx for same. Administered Solumedrol 80mg  IM, then begin prednisone burst/taper. Rx given for Tessalon at bedtime. Rx for albuterol inhaler.   Final Clinical Impressions(s) / UC Diagnoses   Final diagnoses:  [id number] virus infection  Nausea without vomiting     Discharge Instructions     Begin prednisone Saturday 08/29/19. Take plain guaifenesin (1200mg  extended release tabs such as Mucinex) twice daily, with plenty of water, for cough and congestion.  Get adequate rest.   May take Delsym Cough Suppressant at bedtime for nighttime cough with Tessalon if needed. Stop  all antihistamines for now, and other non-prescription cough/cold preparations.   Followup with family doctor for management of your Vitamin D insufficiency.  If your COVID19 test is positive, then you are infected with the novel coronavirus and could give the virus to others.  Please continue isolation at home for at least 10 days since the start of your symptoms.  Once you complete your 10 day quarantine, you may return to normal activities as long as you've not had a fever for over 24 hours (without taking fever reducing medicine) and your symptoms are improving. Please continue good preventive care measures, including:  frequent hand-washing, avoid touching your face, cover coughs/sneezes, stay out of crowds and keep a 6 foot distance from others.  Go to the nearest hospital emergency room if fever/cough/breathlessness are severe or illness seems like a threat to life.     ED Prescriptions    Medication Sig Dispense Auth. Provider   azithromycin (ZITHROMAX Z-PAK) 250 MG tablet Take 2 tabs today; then begin one tab once daily for 4 more days. 6 tablet Kandra Nicolas, MD   predniSONE (DELTASONE) 20 MG tablet Take one tab by mouth twice daily for 4 days, then one daily for 3 days. Take with food. 11 tablet Kandra Nicolas, MD   ondansetron (ZOFRAN ODT) 4 MG disintegrating tablet Take one tab by mouth Q6hr prn nausea.  Dissolve under tongue. 12 tablet Kandra Nicolas, MD   benzonatate (TESSALON) 200 MG capsule Take one cap by mouth at bedtime as needed for cough.  May repeat in 4 to 6 hours 15 capsule Assunta Found Ishmael Holter, MD   albuterol (  VENTOLIN HFA) 108 (90 Base) MCG/ACT inhaler Inhale 2 puffs into the lungs every 4 (four) hours as needed for wheezing or shortness of breath. 18 g Lattie Haw, MD        Lattie Haw, MD 08/28/19 [phone]

## 2019-08-28 NOTE — ED Triage Notes (Signed)
Covid 19 Positive 08/24/19 SOB, Fatigue x 1 week, taking robitussin, mucinex DM, Tylenol

## 2019-08-28 NOTE — Telephone Encounter (Signed)
Opened to review 

## 2019-08-28 NOTE — Patient Instructions (Addendum)
Recent + with covid test and significant fever with sob. Concern for covid pneumonia as we approach this weekend. Virtual visit not adequate. Recommend be seen at [facility] for cxr, labs and 02 sat to determined proper level of tx.  Will follow work up sent to me in epic.  Pt expressed understand.  Follow up date pending UC evaluation.

## 2019-08-28 NOTE — Telephone Encounter (Signed)
Pt needs appointment to discuss. I would recommend UC or ED. Will benefit from chest xray. Also needs 02 sat. Don't want to due virtual with sob complaint.

## 2019-10-20 ENCOUNTER — Encounter: Payer: Self-pay | Admitting: Medical

## 2019-12-29 ENCOUNTER — Ambulatory Visit: Payer: No Typology Code available for payment source

## 2020-05-28 ENCOUNTER — Other Ambulatory Visit: Payer: No Typology Code available for payment source

## 2020-10-20 ENCOUNTER — Telehealth: Payer: Self-pay | Admitting: *Deleted

## 2020-10-20 NOTE — Telephone Encounter (Signed)
Returned call from 1:10 PM. Patient left a message about obtaining immunization records. Left patient a message to look in MyChart to see if they are available to print or forward.

## 2024-02-03 ENCOUNTER — Encounter: Admitting: Obstetrics and Gynecology

## 2024-03-18 ENCOUNTER — Encounter: Admitting: Obstetrics and Gynecology

## 2024-03-23 ENCOUNTER — Telehealth: Payer: Self-pay | Admitting: *Deleted

## 2024-03-23 NOTE — Telephone Encounter (Signed)
Left patient a message to call and reschedule appointment.
